# Patient Record
Sex: Male | Born: 1970 | Hispanic: No | Marital: Married | Smoking: Former smoker
Health system: Southern US, Community
[De-identification: ages and names within clinical notes are randomized; demographics above are authoritative.]

## PROBLEM LIST (undated history)

## (undated) DIAGNOSIS — E7800 Pure hypercholesterolemia, unspecified: Secondary | ICD-10-CM

## (undated) HISTORY — PX: FOOT SURGERY: SHX648

---

## 2002-08-02 ENCOUNTER — Inpatient Hospital Stay (HOSPITAL_COMMUNITY): Admission: EM | Admit: 2002-08-02 | Discharge: 2002-08-04 | Payer: Self-pay | Admitting: Emergency Medicine

## 2002-08-02 ENCOUNTER — Encounter: Payer: Self-pay | Admitting: General Surgery

## 2002-08-02 ENCOUNTER — Encounter: Payer: Self-pay | Admitting: Emergency Medicine

## 2014-11-05 LAB — COMPREHENSIVE METABOLIC PANEL
ALBUMIN - PEBFLD: 4.4
ALK PHOS: 74 U/L
ALT: 27
AST: 24 U/L
BILIRUBIN TOTAL: 0.8 mg/dL
BUN: 18
CHOLESTEROL: 183
CO2: 25 mmol/L
Calcium: 9.3 mg/dL
Chloride: 105 mmol/L
Creat: 0.98
GLUCOSE: 105
HDL Cholesterol: 47
Ldl Cholesterol, Calc: 112
POTASSIUM: 4.2 mmol/L
SODIUM: 134
TOTAL PROTEIN: 7.2 g/dL
TRIGLYCERIDES: 121
Total CHOL/HDL Ratio: 3.9
VLDL Cholesterol, Calc: 24

## 2014-11-09 ENCOUNTER — Encounter: Payer: Self-pay | Admitting: Physician Assistant

## 2014-11-09 ENCOUNTER — Ambulatory Visit: Payer: Self-pay | Admitting: Physician Assistant

## 2014-11-09 VITALS — BP 140/84 | HR 60 | Temp 98.1°F | Ht 68.75 in | Wt 219.4 lb

## 2014-11-09 DIAGNOSIS — E785 Hyperlipidemia, unspecified: Secondary | ICD-10-CM | POA: Insufficient documentation

## 2014-11-09 MED ORDER — PRAVASTATIN SODIUM 40 MG PO TABS: 40.0000 mg | ORAL_TABLET | Freq: Every day | ORAL | Status: DC

## 2014-11-09 NOTE — Progress Notes (Signed)
   BP 140/84 mmHg  Pulse 60  Temp(Src) 98.1 F (36.7 C)  Ht 5' 8.75" (1.746 m)  Wt 219 lb 6.4 oz (99.519 kg)  BMI 32.65 kg/m2  SpO2 95%   Subjective:    Patient ID: Devin Ho Haigler, male    DOB: 04-04-1970, 44 y.o.   MRN: [id number]  HPI: Devin Ho Zunker is a 44 y.o. male presenting on 11/09/2014 for Hyperlipidemia   HPI   Chief Complaint  Patient presents with  . Hyperlipidemia    pt states he is feeling well. pt got labs drawn on Friday   Pt states BP up today b/c 1 he is angry and 2 he has been using an otc for sneezing that has decongestant in it.  His last 2 OV blood pressures were good.  Relevant past medical, surgical, family and social history reviewed and updated as indicated. Interim medical history since our last visit reviewed. Allergies and medications reviewed and updated.  Review of Systems  Constitutional: Positive for diaphoresis. Negative for fever, chills, appetite change, fatigue and unexpected weight change.  HENT: Positive for sneezing. Negative for congestion, dental problem, drooling, ear pain, facial swelling, hearing loss, mouth sores, sore throat, trouble swallowing and voice change.   Eyes: Negative for pain, discharge, redness, itching and visual disturbance.  Respiratory: Positive for cough. Negative for choking, shortness of breath and wheezing.   Cardiovascular: Negative for chest pain, palpitations and leg swelling.  Gastrointestinal: Negative for vomiting, abdominal pain, diarrhea, constipation and blood in stool.  Endocrine: Negative for cold intolerance, heat intolerance and polydipsia.  Genitourinary: Negative for dysuria, hematuria and decreased urine volume.  Musculoskeletal: Negative for back pain, arthralgias and gait problem.  Skin: Negative for rash.  Allergic/Immunologic: Positive for environmental allergies.  Neurological: Negative for seizures, syncope, light-headedness and headaches.  Hematological: Negative for adenopathy.   Psychiatric/Behavioral: Negative for suicidal ideas, dysphoric mood and agitation. The patient is not nervous/anxious.     Per HPI unless specifically indicated above     Objective:    BP 140/84 mmHg  Pulse 60  Temp(Src) 98.1 F (36.7 C)  Ht 5' 8.75" (1.746 m)  Wt 219 lb 6.4 oz (99.519 kg)  BMI 32.65 kg/m2  SpO2 95%  Wt Readings from Last 3 Encounters:  11/09/14 219 lb 6.4 oz (99.519 kg)    Physical Exam  Constitutional: He is oriented to person, place, and time. He appears well-developed and well-nourished.  HENT:  Head: Normocephalic and atraumatic.  Neck: Neck supple.  Cardiovascular: Normal rate and regular rhythm.   Pulmonary/Chest: Effort normal and breath sounds normal. He has no wheezes.  Abdominal: Soft. Bowel sounds are normal. There is no tenderness.  Musculoskeletal: He exhibits no edema.  Lymphadenopathy:    He has no cervical adenopathy.  Neurological: He is alert and oriented to person, place, and time.  Skin: Skin is warm and dry.  Psychiatric: He has a normal mood and affect. His behavior is normal.  Vitals reviewed.       Assessment & Plan:      Encounter Diagnosis  Name Primary?  . Hyperlipemia Yes   Reviewed labs with pt. Lipids still high Increase proavstatin to 40. Cont lowfat diet Pt counseled to avoid otc decongestants. Will monitor bp F/u 3 mo. rto sooner prn

## 2014-11-30 DIAGNOSIS — Z880 Allergy status to penicillin: Secondary | ICD-10-CM | POA: Insufficient documentation

## 2014-11-30 DIAGNOSIS — Z87891 Personal history of nicotine dependence: Secondary | ICD-10-CM | POA: Insufficient documentation

## 2014-11-30 DIAGNOSIS — J40 Bronchitis, not specified as acute or chronic: Secondary | ICD-10-CM | POA: Insufficient documentation

## 2014-11-30 DIAGNOSIS — E7800 Pure hypercholesterolemia, unspecified: Secondary | ICD-10-CM | POA: Insufficient documentation

## 2014-12-01 ENCOUNTER — Encounter (HOSPITAL_COMMUNITY): Payer: Self-pay | Admitting: *Deleted

## 2014-12-01 ENCOUNTER — Emergency Department (HOSPITAL_COMMUNITY): Payer: Self-pay

## 2014-12-01 ENCOUNTER — Emergency Department (HOSPITAL_COMMUNITY)
Admission: EM | Admit: 2014-12-01 | Discharge: 2014-12-01 | Disposition: A | Discharge status: Home / Self Care | Payer: Self-pay | Attending: Emergency Medicine | Admitting: Emergency Medicine

## 2014-12-01 DIAGNOSIS — J209 Acute bronchitis, unspecified: Secondary | ICD-10-CM

## 2014-12-01 HISTORY — DX: Pure hypercholesterolemia, unspecified: E78.00

## 2014-12-01 MED ORDER — ALBUTEROL SULFATE (2.5 MG/3ML) 0.083% IN NEBU: 5.0000 mg | INHALATION_SOLUTION | Freq: Once | RESPIRATORY_TRACT | Status: DC

## 2014-12-01 MED ORDER — ALBUTEROL SULFATE (2.5 MG/3ML) 0.083% IN NEBU
2.5000 mg | INHALATION_SOLUTION | Freq: Once | RESPIRATORY_TRACT | Status: AC
Start: 2014-12-01 — End: 2014-12-01
  Administered 2014-12-01: 2.5 mg via RESPIRATORY_TRACT
  Filled 2014-12-01: qty 3

## 2014-12-01 MED ORDER — PREDNISONE 50 MG PO TABS
60.0000 mg | ORAL_TABLET | Freq: Once | ORAL | Status: AC
  Administered 2014-12-01: 60 mg via ORAL
  Filled 2014-12-01: qty 1

## 2014-12-01 MED ORDER — ALBUTEROL SULFATE HFA 108 (90 BASE) MCG/ACT IN AERS
2.0000 | INHALATION_SPRAY | RESPIRATORY_TRACT | Status: DC | PRN
  Administered 2014-12-01: 2 via RESPIRATORY_TRACT
  Filled 2014-12-01: qty 6.7

## 2014-12-01 MED ORDER — DM-GUAIFENESIN ER 30-600 MG PO TB12
1.0000 | ORAL_TABLET | Freq: Two times a day (BID) | ORAL | Status: DC
  Administered 2014-12-01: 1 via ORAL
  Filled 2014-12-01: qty 1

## 2014-12-01 MED ORDER — AEROCHAMBER Z-STAT PLUS/MEDIUM MISC: 1.0000 | Freq: Once | Status: DC

## 2014-12-01 MED ORDER — PREDNISONE 20 MG PO TABS: ORAL_TABLET | ORAL | Status: DC

## 2014-12-01 MED ORDER — ALBUTEROL SULFATE HFA 108 (90 BASE) MCG/ACT IN AERS: 2.0000 | INHALATION_SPRAY | RESPIRATORY_TRACT | Status: DC | PRN

## 2014-12-01 MED ORDER — DM-GUAIFENESIN ER 30-600 MG PO TB12: 1.0000 | ORAL_TABLET | Freq: Two times a day (BID) | ORAL | Status: DC | PRN

## 2014-12-01 MED ORDER — IPRATROPIUM BROMIDE 0.02 % IN SOLN: 0.5000 mg | Freq: Once | RESPIRATORY_TRACT | Status: DC

## 2014-12-01 MED ORDER — IPRATROPIUM-ALBUTEROL 0.5-2.5 (3) MG/3ML IN SOLN
3.0000 mL | Freq: Once | RESPIRATORY_TRACT | Status: AC
  Administered 2014-12-01: 3 mL via RESPIRATORY_TRACT
  Filled 2014-12-01: qty 3

## 2014-12-01 NOTE — Discharge Instructions (Signed)
Use the inhaler for shortness of breath, chest tightness and coughing. Take the mucinex DM for cough. Take the prednisone until gone. Recheck if you get a fever, cough up yellow or green mucus or your breathing gets worse instead of better.

## 2014-12-01 NOTE — ED Provider Notes (Signed)
CSN: [id number]     Arrival date & time 11/30/14  2349 History   First MD Initiated Contact with Patient 12/01/14 0138   Chief Complaint  Patient presents with  . Cough   Level V caveat due to language barrier  (Consider location/radiation/quality/duration/timing/severity/associated sxs/prior Treatment) HPI wife, who speaks [place], reports on November 19 patient started having a cough in the afternoon. The cough has been dry. He complains of his chest feeling tight and that sometimes they can hear wheezing. He has had a inhaler that he used in the past however he has not used it this time. They deny fever, chills, sore throat, or rhinorrhea. He had sneezing she states all day on November 20. He denies any earache. Today he has been coughing continuously.   PCP Katherene PontoMcElroy  Past Medical History  Diagnosis Date  . Hypercholesteremia    Past Surgical History  Procedure Laterality Date  . Foot surgery Left    History reviewed. No pertinent family history. Social History  Substance Use Topics  . Smoking status: Former Smoker -- 0.25 packs/day for 30 years    Types: Cigarettes    Quit date: 01/08/2009  . Smokeless tobacco: Never Used  . Alcohol Use: No   works in Aeronautical engineerlandscaping  Review of Systems  All other systems reviewed and are negative.     Allergies  Penicillins  Home Medications   Prior to Admission medications   Medication Sig Start Date End Date Taking? Authorizing Provider  albuterol (PROVENTIL HFA;VENTOLIN HFA) 108 (90 BASE) MCG/ACT inhaler Inhale 2 puffs into the lungs every 4 (four) hours as needed. 12/01/14   Devoria AlbeIva Mlissa Tamayo, MD  dextromethorphan-guaiFENesin (MUCINEX DM) 30-600 MG 12hr tablet Take 1 tablet by mouth 2 (two) times daily as needed for cough. 12/01/14   Devoria AlbeIva Emonee Winkowski, MD  pravastatin (PRAVACHOL) 40 MG tablet Take 1 tablet (40 mg total) by mouth at bedtime. Tome una tableta por boca al dormir 11/09/14   Jacquelin HawkingShannon McElroy, PA-C  predniSONE (DELTASONE) 20 MG tablet  Take 3 po QD x 3d , then 2 po QD x 3d then 1 po QD x 3d 12/01/14   Devoria AlbeIva Jerick Khachatryan, MD   BP 134/91 mmHg  Pulse 70  Temp(Src) 97.7 F (36.5 C) (Oral)  Resp 20  Ht 5\' 7"  (1.702 m)  Wt 219 lb (99.338 kg)  BMI 34.29 kg/m2  SpO2 99%  Vital signs normal   Physical Exam  Constitutional: He is oriented to person, place, and time. He appears well-developed and well-nourished.  Non-toxic appearance. He does not appear ill. No distress.  HENT:  Head: Normocephalic and atraumatic.  Right Ear: External ear normal.  Left Ear: External ear normal.  Nose: Nose normal. No mucosal edema or rhinorrhea.  Mouth/Throat: Oropharynx is clear and moist and mucous membranes are normal. No dental abscesses or uvula swelling.  Eyes: Conjunctivae and EOM are normal. Pupils are equal, round, and reactive to light.  Neck: Normal range of motion and full passive range of motion without pain. Neck supple.  Cardiovascular: Normal rate, regular rhythm and normal heart sounds.  Exam reveals no gallop and no friction rub.   No murmur heard. Pulmonary/Chest: Effort normal. No respiratory distress. He has decreased breath sounds. He has no wheezes. He has no rhonchi. He has no rales. He exhibits no tenderness and no crepitus.  Patient is continuously coughing  Abdominal: Soft. Normal appearance and bowel sounds are normal. He exhibits no distension. There is no tenderness. There is no rebound and no  guarding.  Musculoskeletal: Normal range of motion. He exhibits no edema or tenderness.  Moves all extremities well.   Neurological: He is alert and oriented to person, place, and time. He has normal strength. No cranial nerve deficit.  Skin: Skin is warm, dry and intact. No rash noted. No erythema. No pallor.  Psychiatric: He has a normal mood and affect. His speech is normal and behavior is normal. His mood appears not anxious.  Nursing note and vitals reviewed.   ED Course  Procedures (including critical care  time)  Medications  dextromethorphan-guaiFENesin (MUCINEX DM) 30-600 MG per 12 hr tablet 1 tablet (1 tablet Oral Given 12/01/14 0159)  albuterol (PROVENTIL HFA;VENTOLIN HFA) 108 (90 BASE) MCG/ACT inhaler 2 puff (not administered)  predniSONE (DELTASONE) tablet 60 mg (not administered)  aerochamber Z-Stat Plus/medium 1 each (not administered)  ipratropium-albuterol (DUONEB) 0.5-2.5 (3) MG/3ML nebulizer solution 3 mL (3 mLs Nebulization Given 12/01/14 0215)  albuterol (PROVENTIL) (2.5 MG/3ML) 0.083% nebulizer solution 2.5 mg (2.5 mg Nebulization Given 12/01/14 0216)    Patient was given albuterol/Atrovent nebulizer to see if that would help with his cough. He also was given a Mucinex DM for his coughing.  Patient was rechecked at time of discharge after his nebulizer treatment. He is lying comfortably on the stretcher in his cough is now gone. On reexam his lungs have improved air movement. There still no wheezing. However patient is much improved after the nebulizer. They were given his CXR results. We discussed going home with a inhaler and also sending him home with steroids for his bronchospasm. Antibiotics were not started because he's only had symptoms for couple days this is most likely a viral type infection.   Imaging Review Dg Chest 2 View  12/01/2014  CLINICAL DATA:  Nonproductive cough, body aches, and sore throat for 4 days. Former smoker. EXAM: CHEST  2 VIEW COMPARISON:  None. FINDINGS: Hyperinflation suggesting emphysema. Normal heart size and pulmonary vascularity. No focal airspace disease or consolidation in the lungs. No blunting of costophrenic angles. No pneumothorax. Mediastinal contours appear intact. Calcification of the aorta. IMPRESSION: Emphysematous changes in the lungs. No evidence of active pulmonary disease. Electronically Signed   By: Burman Nieves M.D.   On: 12/01/2014 00:30   I have personally reviewed and evaluated these images and lab results as part of my  medical decision-making.   EKG Interpretation None      MDM   Final diagnoses:  Bronchitis with bronchospasm    New Prescriptions   ALBUTEROL (PROVENTIL HFA;VENTOLIN HFA) 108 (90 BASE) MCG/ACT INHALER    Inhale 2 puffs into the lungs every 4 (four) hours as needed.   DEXTROMETHORPHAN-GUAIFENESIN (MUCINEX DM) 30-600 MG 12HR TABLET    Take 1 tablet by mouth 2 (two) times daily as needed for cough.   PREDNISONE (DELTASONE) 20 MG TABLET    Take 3 po QD x 3d , then 2 po QD x 3d then 1 po QD x 3d    Plan discharge  Devoria Albe, MD, Concha Pyo, MD 12/01/14 [phone]

## 2014-12-01 NOTE — ED Notes (Signed)
Pt c/o cough x 3 days; pt has been taking OTC medications with no relief; pt states he has not been coughing up anything, just dry cough

## 2014-12-16 ENCOUNTER — Ambulatory Visit: Payer: Self-pay | Admitting: Physician Assistant

## 2014-12-16 ENCOUNTER — Encounter: Payer: Self-pay | Admitting: Physician Assistant

## 2014-12-16 VITALS — BP 142/88 | HR 74 | Temp 97.9°F | Ht 68.75 in | Wt 222.2 lb

## 2014-12-16 DIAGNOSIS — J069 Acute upper respiratory infection, unspecified: Secondary | ICD-10-CM

## 2014-12-16 MED ORDER — ALBUTEROL SULFATE HFA 108 (90 BASE) MCG/ACT IN AERS: INHALATION_SPRAY | RESPIRATORY_TRACT | Status: DC

## 2014-12-16 MED ORDER — BENZONATATE 100 MG PO CAPS: ORAL_CAPSULE | ORAL | Status: DC

## 2014-12-16 NOTE — Progress Notes (Signed)
   BP 142/88 mmHg  Pulse 74  Temp(Src) 97.9 F (36.6 C)  Ht 5' 8.75" (1.746 m)  Wt 222 lb 3.2 oz (100.789 kg)  BMI 33.06 kg/m2  SpO2 96%   Subjective:    Patient ID: Devin Ho, male    DOB: 02/22/1970, 44 y.o.   MRN: [id number]  HPI: Devin Ho is a 44 y.o. male presenting on 12/16/2014 for Nasal Congestion and Cough   HPI  Chief Complaint  Patient presents with  . Nasal Congestion    sx began one month ago  . Cough    Pt went to ER for same on 12/01/14. He was given prednisone and inhaler and he says that it helped.  Pt complains now of cough and nasal congestion.    Relevant past medical, surgical, family and social history reviewed and updated as indicated. Interim medical history since our last visit reviewed. Allergies and medications reviewed and updated.  Review of Systems  Constitutional: Positive for diaphoresis and fatigue. Negative for fever, chills, appetite change and unexpected weight change.  HENT: Positive for congestion, sneezing and voice change. Negative for dental problem, drooling, ear pain, facial swelling, hearing loss, mouth sores, sore throat and trouble swallowing.   Eyes: Negative for pain, discharge, redness, itching and visual disturbance.  Respiratory: Positive for cough and shortness of breath. Negative for choking and wheezing.   Cardiovascular: Negative for chest pain, palpitations and leg swelling.  Gastrointestinal: Negative for vomiting, abdominal pain, diarrhea, constipation and blood in stool.  Endocrine: Negative for cold intolerance, heat intolerance and polydipsia.  Genitourinary: Negative for dysuria, hematuria and decreased urine volume.  Musculoskeletal: Positive for back pain. Negative for arthralgias and gait problem.  Skin: Negative for rash.  Allergic/Immunologic: Negative for environmental allergies.  Neurological: Negative for seizures, syncope, light-headedness and headaches.  Hematological: Negative for adenopathy.   Psychiatric/Behavioral: Negative for suicidal ideas, dysphoric mood and agitation. The patient is not nervous/anxious.     Per HPI unless specifically indicated above     Objective:    BP 142/88 mmHg  Pulse 74  Temp(Src) 97.9 F (36.6 C)  Ht 5' 8.75" (1.746 m)  Wt 222 lb 3.2 oz (100.789 kg)  BMI 33.06 kg/m2  SpO2 96%  Wt Readings from Last 3 Encounters:  12/16/14 222 lb 3.2 oz (100.789 kg)  12/01/14 219 lb (99.338 kg)  11/09/14 219 lb 6.4 oz (99.519 kg)    Physical Exam  Constitutional: He is oriented to person, place, and time. He appears well-developed and well-nourished.  HENT:  Head: Normocephalic and atraumatic.  Neck: Neck supple.  Cardiovascular: Normal rate and regular rhythm.   Pulmonary/Chest: Effort normal and breath sounds normal. He has no wheezes.  Abdominal: Soft. Bowel sounds are normal. There is no tenderness.  Lymphadenopathy:    He has no cervical adenopathy.  Neurological: He is alert and oriented to person, place, and time.  Skin: Skin is warm and dry.  Psychiatric: He has a normal mood and affect. His behavior is normal.  Vitals reviewed.   No results found for this or any previous visit.    Assessment & Plan:   Encounter Diagnosis  Name Primary?  . Acute upper respiratory infection Yes    Pt counseled that cough can take some time to clear.  rx tessalon. Pt wanted another inhaler so rx sent for that as well.  F/u feb as scheduled. rto sooner prn

## 2014-12-20 ENCOUNTER — Telehealth: Payer: Self-pay | Admitting: Student

## 2014-12-20 NOTE — Telephone Encounter (Signed)
Pt's wife Malachi Bonds(Gloria), called stating that pt continues with persistent cough even after taking tessalon given to him by Jacquelin HawkingShannon McElroy, PA-C at last OV. i called pt back after talking to PA-C and she recommended pt to take delsym or robitussin, and for pt to use cough drops. Pt's wife understood, and stated she will get him to try the recommendations.

## 2015-01-21 ENCOUNTER — Encounter: Payer: Self-pay | Admitting: Physician Assistant

## 2015-02-09 ENCOUNTER — Encounter: Payer: Self-pay | Admitting: Physician Assistant

## 2015-02-09 ENCOUNTER — Ambulatory Visit: Payer: Self-pay | Admitting: Physician Assistant

## 2015-02-09 VITALS — BP 140/96 | HR 64 | Temp 97.9°F | Ht 68.75 in | Wt 227.0 lb

## 2015-02-09 DIAGNOSIS — E669 Obesity, unspecified: Secondary | ICD-10-CM

## 2015-02-09 DIAGNOSIS — E785 Hyperlipidemia, unspecified: Secondary | ICD-10-CM

## 2015-02-09 DIAGNOSIS — J449 Chronic obstructive pulmonary disease, unspecified: Secondary | ICD-10-CM

## 2015-02-09 DIAGNOSIS — I10 Essential (primary) hypertension: Secondary | ICD-10-CM | POA: Insufficient documentation

## 2015-02-09 LAB — COMPLETE METABOLIC PANEL WITH GFR
ALT: 28 U/L (ref 9–46)
AST: 23 U/L (ref 10–40)
Albumin: 4.5 g/dL (ref 3.6–5.1)
Alkaline Phosphatase: 66 U/L (ref 40–115)
BILIRUBIN TOTAL: 0.6 mg/dL (ref 0.2–1.2)
BUN: 16 mg/dL (ref 7–25)
CO2: 27 mmol/L (ref 20–31)
CREATININE: 1.04 mg/dL (ref 0.60–1.35)
Calcium: 9.7 mg/dL (ref 8.6–10.3)
Chloride: 104 mmol/L (ref 98–110)
GFR, Est Non African American: 86 mL/min (ref >=60)
GLUCOSE: 102 mg/dL — AB (ref 65–99)
Potassium: 4.8 mmol/L (ref 3.5–5.3)
Sodium: 140 mmol/L (ref 135–146)
TOTAL PROTEIN: 7.4 g/dL (ref 6.1–8.1)

## 2015-02-09 LAB — LIPID PANEL
Cholesterol: 208 mg/dL — ABNORMAL HIGH (ref 125–200)
HDL: 57 mg/dL (ref >=40)
LDL CALC: 120 mg/dL (ref <130)
Total CHOL/HDL Ratio: 3.6 Ratio (ref <=5.0)
Triglycerides: 154 mg/dL — ABNORMAL HIGH (ref <150)
VLDL: 31 mg/dL — AB (ref <30)

## 2015-02-09 MED ORDER — LISINOPRIL 10 MG PO TABS: 10.0000 mg | ORAL_TABLET | Freq: Every day | ORAL | Status: DC

## 2015-02-09 NOTE — Progress Notes (Signed)
BP 140/96 mmHg  Pulse 64  Temp(Src) 97.9 F (36.6 C)  Ht 5' 8.75" (1.746 m)  Wt 227 lb (102.967 kg)  BMI 33.78 kg/m2  SpO2 96%   Subjective:    Patient ID: Devin Ho, male    DOB: 26-Mar-1970, 45 y.o.   MRN: [id number]  HPI: Devin Ho is a 45 y.o. male presenting on 02/09/2015 for Hyperlipidemia   HPI   Pt feeling well.  Relevant past medical, surgical, family and social history reviewed and updated as indicated. Interim medical history since our last visit reviewed. Allergies and medications reviewed and updated.   Current outpatient prescriptions:  .  albuterol (PROVENTIL HFA;VENTOLIN HFA) 108 (90 BASE) MCG/ACT inhaler, 2 puffs q 6 hr prn. haga 2 inhalaciones en los pulmones cada 6 horas cuando necesite para simbilancias o falta de aire, Disp: 1 Inhaler, Rfl: 3 .  budesonide-formoterol (SYMBICORT) 160-4.5 MCG/ACT inhaler, Inhale 2 puffs into the lungs 2 (two) times daily., Disp: , Rfl:  .  pravastatin (PRAVACHOL) 40 MG tablet, Take 1 tablet (40 mg total) by mouth at bedtime. Tome una tableta por boca al dormir, Disp: 30 tablet, Rfl: 4   Review of Systems  Constitutional: Negative for fever, chills, diaphoresis, appetite change, fatigue and unexpected weight change.  HENT: Positive for sneezing. Negative for congestion, dental problem, drooling, ear pain, facial swelling, hearing loss, mouth sores, sore throat, trouble swallowing and voice change.   Eyes: Negative for pain, discharge, redness and visual disturbance.  Respiratory: Negative for cough, choking, shortness of breath and wheezing.   Cardiovascular: Negative for chest pain, palpitations and leg swelling.  Gastrointestinal: Negative for vomiting, abdominal pain, diarrhea, constipation and blood in stool.  Endocrine: Negative for cold intolerance, heat intolerance and polydipsia.  Genitourinary: Negative for dysuria, hematuria and decreased urine volume.  Musculoskeletal: Negative for back pain, arthralgias and  gait problem.  Skin: Negative for rash.  Allergic/Immunologic: Negative for environmental allergies.  Neurological: Negative for seizures, syncope, light-headedness and headaches.  Hematological: Negative for adenopathy.  Psychiatric/Behavioral: Negative for suicidal ideas, dysphoric mood and agitation. The patient is not nervous/anxious.     Per HPI unless specifically indicated above     Objective:    BP 140/96 mmHg  Pulse 64  Temp(Src) 97.9 F (36.6 C)  Ht 5' 8.75" (1.746 m)  Wt 227 lb (102.967 kg)  BMI 33.78 kg/m2  SpO2 96%  Wt Readings from Last 3 Encounters:  02/09/15 227 lb (102.967 kg)  12/16/14 222 lb 3.2 oz (100.789 kg)  12/01/14 219 lb (99.338 kg)    Physical Exam  Constitutional: He is oriented to person, place, and time. He appears well-developed and well-nourished.  HENT:  Head: Normocephalic and atraumatic.  Neck: Neck supple.  Cardiovascular: Normal rate and regular rhythm.   Pulmonary/Chest: Effort normal and breath sounds normal. He has no wheezes.  Abdominal: Soft. Bowel sounds are normal. There is no hepatosplenomegaly. There is no tenderness.  Musculoskeletal: He exhibits no edema.  Lymphadenopathy:    He has no cervical adenopathy.  Neurological: He is alert and oriented to person, place, and time.  Skin: Skin is warm and dry.  Psychiatric: He has a normal mood and affect. His behavior is normal.  Vitals reviewed.       Assessment & Plan:   Encounter Diagnoses  Name Primary?  . Hyperlipidemia Yes  . Essential hypertension   . Chronic obstructive pulmonary disease, unspecified COPD type (HCC)   . Obesity, unspecified     -rx lisinopril  for bp -Get fasting labs drawn. Will call with results -f/u 6 wk to recheck bp

## 2015-03-23 ENCOUNTER — Ambulatory Visit: Payer: Self-pay | Admitting: Physician Assistant

## 2015-03-23 ENCOUNTER — Encounter: Payer: Self-pay | Admitting: Physician Assistant

## 2015-03-23 VITALS — BP 124/88 | HR 71 | Temp 97.9°F | Ht 68.75 in | Wt 227.7 lb

## 2015-03-23 DIAGNOSIS — E785 Hyperlipidemia, unspecified: Secondary | ICD-10-CM

## 2015-03-23 DIAGNOSIS — J449 Chronic obstructive pulmonary disease, unspecified: Secondary | ICD-10-CM

## 2015-03-23 DIAGNOSIS — I10 Essential (primary) hypertension: Secondary | ICD-10-CM

## 2015-03-23 DIAGNOSIS — E669 Obesity, unspecified: Secondary | ICD-10-CM

## 2015-03-23 MED ORDER — LISINOPRIL 10 MG PO TABS: 10.0000 mg | ORAL_TABLET | Freq: Every day | ORAL | Status: DC

## 2015-03-23 MED ORDER — PRAVASTATIN SODIUM 40 MG PO TABS: 40.0000 mg | ORAL_TABLET | Freq: Every day | ORAL | Status: DC

## 2015-03-23 NOTE — Progress Notes (Signed)
BP 124/88 mmHg  Pulse 71  Temp(Src) 97.9 F (36.6 C)  Ht 5' 8.75" (1.746 m)  Wt 227 lb 11.2 oz (103.284 kg)  BMI 33.88 kg/m2  SpO2 96%   Subjective:    Patient ID: Devin Ho, male    DOB: February 22, 1970, 45 y.o.   MRN: [id number]  HPI: Devin FeinsteinLuis Terpening is a 45 y.o. male presenting on 03/23/2015 for Hypertension   HPI  Pt is feeling well  Relevant past medical, surgical, family and social history reviewed and updated as indicated. Interim medical history since our last visit reviewed. Allergies and medications reviewed and updated.  Current outpatient prescriptions:  .  budesonide-formoterol (SYMBICORT) 160-4.5 MCG/ACT inhaler, Inhale 2 puffs into the lungs 2 (two) times daily., Disp: , Rfl:  .  lisinopril (PRINIVIL,ZESTRIL) 10 MG tablet, Take 1 tablet (10 mg total) by mouth daily. Tome una tableta por boca diaria, Disp: 30 tablet, Rfl: 1 .  pravastatin (PRAVACHOL) 40 MG tablet, Take 1 tablet (40 mg total) by mouth at bedtime. Tome una tableta por boca al dormir, Disp: 30 tablet, Rfl: 4 .  albuterol (PROVENTIL HFA;VENTOLIN HFA) 108 (90 BASE) MCG/ACT inhaler, 2 puffs q 6 hr prn. haga 2 inhalaciones en los pulmones cada 6 horas cuando necesite para simbilancias o falta de aire (Patient not taking: Reported on 03/23/2015), Disp: 1 Inhaler, Rfl: 3  Review of Systems  Constitutional: Negative for fever, chills, diaphoresis, appetite change, fatigue and unexpected weight change.  HENT: Negative for congestion, dental problem, drooling, ear pain, facial swelling, hearing loss, mouth sores, sneezing, sore throat, trouble swallowing and voice change.   Eyes: Negative for pain, discharge, redness, itching and visual disturbance.  Respiratory: Negative for cough, choking, shortness of breath and wheezing.   Cardiovascular: Negative for chest pain, palpitations and leg swelling.  Gastrointestinal: Negative for vomiting, abdominal pain, diarrhea, constipation and blood in stool.  Endocrine:  Negative for cold intolerance, heat intolerance and polydipsia.  Genitourinary: Negative for dysuria, hematuria and decreased urine volume.  Musculoskeletal: Negative for back pain, arthralgias and gait problem.  Skin: Negative for rash.  Allergic/Immunologic: Negative for environmental allergies.  Neurological: Negative for seizures, syncope, light-headedness and headaches.  Hematological: Negative for adenopathy.  Psychiatric/Behavioral: Negative for suicidal ideas, dysphoric mood and agitation. The patient is not nervous/anxious.     Per HPI unless specifically indicated above     Objective:    BP 124/88 mmHg  Pulse 71  Temp(Src) 97.9 F (36.6 C)  Ht 5' 8.75" (1.746 m)  Wt 227 lb 11.2 oz (103.284 kg)  BMI 33.88 kg/m2  SpO2 96%  Wt Readings from Last 3 Encounters:  03/23/15 227 lb 11.2 oz (103.284 kg)  02/09/15 227 lb (102.967 kg)  12/16/14 222 lb 3.2 oz (100.789 kg)    Physical Exam  Constitutional: He is oriented to person, place, and time. He appears well-developed and well-nourished.  HENT:  Head: Normocephalic and atraumatic.  Neck: Neck supple.  Cardiovascular: Normal rate and regular rhythm.   Pulmonary/Chest: Effort normal and breath sounds normal. He has no wheezes.  Abdominal: Soft. Bowel sounds are normal. There is no hepatosplenomegaly. There is no tenderness.  Musculoskeletal: He exhibits no edema.  Lymphadenopathy:    He has no cervical adenopathy.  Neurological: He is alert and oriented to person, place, and time.  Skin: Skin is warm and dry.  Psychiatric: He has a normal mood and affect. His behavior is normal.  Vitals reviewed.   Results for orders placed or performed in visit  on 02/09/15  COMPLETE METABOLIC PANEL WITH GFR  Result Value Ref Range   Sodium 140 135 - 146 mmol/L   Potassium 4.8 3.5 - 5.3 mmol/L   Chloride 104 98 - 110 mmol/L   CO2 27 20 - 31 mmol/L   Glucose, Bld 102 (H) 65 - 99 mg/dL   BUN 16 7 - 25 mg/dL   Creat 1.61 0.96 - 0.45  mg/dL   Total Bilirubin 0.6 0.2 - 1.2 mg/dL   Alkaline Phosphatase 66 40 - 115 U/L   AST 23 10 - 40 U/L   ALT 28 9 - 46 U/L   Total Protein 7.4 6.1 - 8.1 g/dL   Albumin 4.5 3.6 - 5.1 g/dL   Calcium 9.7 8.6 - 40.9 mg/dL   GFR, Est African American >89 >=60 mL/min   GFR, Est Non African American 86 >=60 mL/min  Lipid Profile  Result Value Ref Range   Cholesterol 208 (H) 125 - 200 mg/dL   Triglycerides 811 (H) <150 mg/dL   HDL 57 >=91 mg/dL   Total CHOL/HDL Ratio 3.6 <=5.0 Ratio   VLDL 31 (H) <30 mg/dL   LDL Cholesterol 478 <295 mg/dL      Assessment & Plan:   Encounter Diagnoses  Name Primary?  . Essential hypertension Yes  . Hyperlipidemia   . Chronic obstructive pulmonary disease, unspecified COPD type (HCC)   . Obesity, unspecified     -Reviewed labs with pt -Continue current rx- bp improved on lisinopril -F/u 3 months.  RTO sooner prn

## 2015-06-16 ENCOUNTER — Other Ambulatory Visit: Payer: Self-pay | Admitting: Physician Assistant

## 2015-06-21 ENCOUNTER — Other Ambulatory Visit: Payer: Self-pay | Admitting: Student

## 2015-06-21 DIAGNOSIS — E785 Hyperlipidemia, unspecified: Secondary | ICD-10-CM

## 2015-06-21 DIAGNOSIS — I10 Essential (primary) hypertension: Secondary | ICD-10-CM

## 2015-06-23 LAB — COMPLETE METABOLIC PANEL WITH GFR
ALBUMIN: 4.2 g/dL (ref 3.6–5.1)
ALK PHOS: 63 U/L (ref 40–115)
ALT: 24 U/L (ref 9–46)
AST: 22 U/L (ref 10–40)
BILIRUBIN TOTAL: 0.4 mg/dL (ref 0.2–1.2)
BUN: 14 mg/dL (ref 7–25)
CALCIUM: 9.3 mg/dL (ref 8.6–10.3)
CO2: 19 mmol/L — AB (ref 20–31)
Chloride: 106 mmol/L (ref 98–110)
Creat: 0.95 mg/dL (ref 0.60–1.35)
GLUCOSE: 102 mg/dL — AB (ref 65–99)
POTASSIUM: 4.2 mmol/L (ref 3.5–5.3)
Sodium: 140 mmol/L (ref 135–146)
Total Protein: 6.9 g/dL (ref 6.1–8.1)

## 2015-06-23 LAB — LIPID PANEL
CHOL/HDL RATIO: 4 ratio (ref <=5.0)
Cholesterol: 186 mg/dL (ref 125–200)
HDL: 47 mg/dL (ref >=40)
LDL CALC: 101 mg/dL (ref <130)
Triglycerides: 191 mg/dL — ABNORMAL HIGH (ref <150)
VLDL: 38 mg/dL — ABNORMAL HIGH (ref <30)

## 2015-06-27 ENCOUNTER — Ambulatory Visit: Payer: Self-pay | Admitting: Physician Assistant

## 2015-06-27 ENCOUNTER — Encounter: Payer: Self-pay | Admitting: Physician Assistant

## 2015-06-27 VITALS — BP 128/70 | HR 63 | Temp 97.7°F | Ht 68.75 in | Wt 224.6 lb

## 2015-06-27 DIAGNOSIS — J449 Chronic obstructive pulmonary disease, unspecified: Secondary | ICD-10-CM

## 2015-06-27 DIAGNOSIS — E785 Hyperlipidemia, unspecified: Secondary | ICD-10-CM

## 2015-06-27 DIAGNOSIS — E669 Obesity, unspecified: Secondary | ICD-10-CM

## 2015-06-27 DIAGNOSIS — I10 Essential (primary) hypertension: Secondary | ICD-10-CM

## 2015-06-27 NOTE — Progress Notes (Signed)
BP 128/70 mmHg  Pulse 63  Temp(Src) 97.7 F (36.5 C)  Ht 5' 8.75" (1.746 m)  Wt 224 lb 9.6 oz (101.878 kg)  BMI 33.42 kg/m2  SpO2 97%   Subjective:    Patient ID: Devin Ho, male    DOB: [birth year], 45 y.o.   MRN: [id number]  HPI: Devin Ho is a 45 y.o. male presenting on 06/27/2015 for Hypertension and Hyperlipidemia   HPI  Pt states he is doing well. No problems.   Relevant past medical, surgical, family and social history reviewed and updated as indicated. Interim medical history since our last visit reviewed. Allergies and medications reviewed and updated.   Current outpatient prescriptions:  .  beclomethasone (QVAR) 80 MCG/ACT inhaler, Inhale 2 puffs into the lungs 2 (two) times daily., Disp: , Rfl:  .  lisinopril (PRINIVIL,ZESTRIL) 10 MG tablet, Take 1 tablet (10 mg total) by mouth daily. Tome una tableta por boca diaria, Disp: 30 tablet, Rfl: 3 .  pravastatin (PRAVACHOL) 40 MG tablet, Take 1 tablet (40 mg total) by mouth at bedtime. Tome una tableta por boca al dormir, Disp: 30 tablet, Rfl: 4 .  albuterol (PROVENTIL HFA;VENTOLIN HFA) 108 (90 BASE) MCG/ACT inhaler, 2 puffs q 6 hr prn. haga 2 inhalaciones en los pulmones cada 6 horas cuando necesite para simbilancias o falta de aire (Patient not taking: Reported on 03/23/2015), Disp: 1 Inhaler, Rfl: 3 .  budesonide-formoterol (SYMBICORT) 160-4.5 MCG/ACT inhaler, Inhale 2 puffs into the lungs 2 (two) times daily. Reported on 06/27/2015, Disp: , Rfl:    Review of Systems  Constitutional: Negative for fever, chills, diaphoresis, appetite change, fatigue and unexpected weight change.  HENT: Negative for congestion, dental problem, drooling, ear pain, facial swelling, hearing loss, mouth sores, sneezing, sore throat, trouble swallowing and voice change.   Eyes: Negative for pain, discharge, redness, itching and visual disturbance.  Respiratory: Negative for cough, choking, shortness of breath and wheezing.    Cardiovascular: Negative for chest pain, palpitations and leg swelling.  Gastrointestinal: Negative for vomiting, abdominal pain, diarrhea, constipation and blood in stool.  Endocrine: Negative for cold intolerance, heat intolerance and polydipsia.  Genitourinary: Negative for dysuria, hematuria and decreased urine volume.  Musculoskeletal: Negative for back pain, arthralgias and gait problem.  Skin: Negative for rash.  Allergic/Immunologic: Positive for environmental allergies.  Neurological: Negative for seizures, syncope, light-headedness and headaches.  Hematological: Negative for adenopathy.  Psychiatric/Behavioral: Negative for suicidal ideas, dysphoric mood and agitation. The patient is not nervous/anxious.     Per HPI unless specifically indicated above     Objective:    BP 128/70 mmHg  Pulse 63  Temp(Src) 97.7 F (36.5 C)  Ht 5' 8.75" (1.746 m)  Wt 224 lb 9.6 oz (101.878 kg)  BMI 33.42 kg/m2  SpO2 97%  Wt Readings from Last 3 Encounters:  06/27/15 224 lb 9.6 oz (101.878 kg)  03/23/15 227 lb 11.2 oz (103.284 kg)  02/09/15 227 lb (102.967 kg)    Physical Exam  Constitutional: He is oriented to person, place, and time. He appears well-developed and well-nourished.  HENT:  Head: Normocephalic and atraumatic.  Neck: Neck supple.  Cardiovascular: Normal rate and regular rhythm.   Pulmonary/Chest: Effort normal and breath sounds normal. He has no wheezes.  Abdominal: Soft. Bowel sounds are normal. There is no hepatosplenomegaly. There is no tenderness.  Musculoskeletal: He exhibits no edema.  Lymphadenopathy:    He has no cervical adenopathy.  Neurological: He is alert and oriented to person, place, and time.  Skin: Skin is warm and dry.  Psychiatric: He has a normal mood and affect. His behavior is normal.  Vitals reviewed.   Results for orders placed or performed in visit on 06/21/15  COMPLETE METABOLIC PANEL WITH GFR  Result Value Ref Range   Sodium 140 135 -  146 mmol/L   Potassium 4.2 3.5 - 5.3 mmol/L   Chloride 106 98 - 110 mmol/L   CO2 19 (L) 20 - 31 mmol/L   Glucose, Bld 102 (H) 65 - 99 mg/dL   BUN 14 7 - 25 mg/dL   Creat 1.61 0.96 - 0.45 mg/dL   Total Bilirubin 0.4 0.2 - 1.2 mg/dL   Alkaline Phosphatase 63 40 - 115 U/L   AST 22 10 - 40 U/L   ALT 24 9 - 46 U/L   Total Protein 6.9 6.1 - 8.1 g/dL   Albumin 4.2 3.6 - 5.1 g/dL   Calcium 9.3 8.6 - 40.9 mg/dL   GFR, Est African American >89 >=60 mL/min   GFR, Est Non African American >89 >=60 mL/min  Lipid Profile  Result Value Ref Range   Cholesterol 186 125 - 200 mg/dL   Triglycerides 811 (H) <150 mg/dL   HDL 47 >=91 mg/dL   Total CHOL/HDL Ratio 4.0 <=5.0 Ratio   VLDL 38 (H) <30 mg/dL   LDL Cholesterol 478 <295 mg/dL      Assessment & Plan:   Encounter Diagnoses  Name Primary?  . Chronic obstructive pulmonary disease, unspecified COPD type (HCC) Yes  . Hyperlipidemia   . Essential hypertension   . Obesity, unspecified     -Reviewed labs with pt -Continue current -F/u 4 months.  RTO sooner prn

## 2015-09-27 ENCOUNTER — Other Ambulatory Visit: Payer: Self-pay | Admitting: Physician Assistant

## 2015-10-19 ENCOUNTER — Other Ambulatory Visit: Payer: Self-pay | Admitting: Student

## 2015-10-19 DIAGNOSIS — E785 Hyperlipidemia, unspecified: Secondary | ICD-10-CM

## 2015-10-19 DIAGNOSIS — I10 Essential (primary) hypertension: Secondary | ICD-10-CM

## 2015-10-21 LAB — COMPLETE METABOLIC PANEL WITH GFR
ALBUMIN: 4.1 g/dL (ref 3.6–5.1)
ALK PHOS: 62 U/L (ref 40–115)
ALT: 25 U/L (ref 9–46)
AST: 22 U/L (ref 10–40)
BILIRUBIN TOTAL: 0.6 mg/dL (ref 0.2–1.2)
BUN: 15 mg/dL (ref 7–25)
CALCIUM: 9.5 mg/dL (ref 8.6–10.3)
CO2: 26 mmol/L (ref 20–31)
CREATININE: 0.92 mg/dL (ref 0.60–1.35)
Chloride: 105 mmol/L (ref 98–110)
GFR, Est African American: >89 mL/min (ref >=60)
GFR, Est Non African American: >89 mL/min (ref >=60)
GLUCOSE: 100 mg/dL — AB (ref 65–99)
Potassium: 4.3 mmol/L (ref 3.5–5.3)
Sodium: 138 mmol/L (ref 135–146)
TOTAL PROTEIN: 6.7 g/dL (ref 6.1–8.1)

## 2015-10-21 LAB — LIPID PANEL
CHOLESTEROL: 161 mg/dL (ref 125–200)
HDL: 43 mg/dL (ref >=40)
LDL Cholesterol: 91 mg/dL (ref <130)
Total CHOL/HDL Ratio: 3.7 Ratio (ref <=5.0)
Triglycerides: 134 mg/dL (ref <150)
VLDL: 27 mg/dL (ref <30)

## 2015-10-26 ENCOUNTER — Ambulatory Visit: Payer: Self-pay | Admitting: Physician Assistant

## 2015-10-26 ENCOUNTER — Encounter: Payer: Self-pay | Admitting: Physician Assistant

## 2015-10-26 VITALS — BP 118/60 | HR 72 | Temp 97.9°F | Wt 226.2 lb

## 2015-10-26 DIAGNOSIS — Z6833 Body mass index (BMI) 33.0-33.9, adult: Secondary | ICD-10-CM

## 2015-10-26 DIAGNOSIS — E669 Obesity, unspecified: Secondary | ICD-10-CM

## 2015-10-26 DIAGNOSIS — E785 Hyperlipidemia, unspecified: Secondary | ICD-10-CM

## 2015-10-26 DIAGNOSIS — I10 Essential (primary) hypertension: Secondary | ICD-10-CM

## 2015-10-26 DIAGNOSIS — J449 Chronic obstructive pulmonary disease, unspecified: Secondary | ICD-10-CM

## 2015-10-26 MED ORDER — LISINOPRIL 10 MG PO TABS: 10.0000 mg | ORAL_TABLET | Freq: Every day | ORAL | 5 refills | Status: DC

## 2015-10-26 NOTE — Progress Notes (Signed)
BP 118/60 (BP Location: Left Arm, Patient Position: Sitting, Cuff Size: Normal)   Pulse 72   Temp 97.9 F (36.6 C)   Wt 226 lb 4 oz (102.6 kg)   SpO2 97%   BMI 33.65 kg/m    Subjective:    Patient ID: Devin Ho, male    DOB: January 28, 1970, 45 y.o.   MRN: [id number]  HPI: Devin Ho is a 45 y.o. male presenting on 10/26/2015 for Hypertension and Hyperlipidemia   HPI   Pt States diarrhea x 2 wk. States 2-3 episodeds daily.  No blood in stool.  No abdominal pain.   No recent antibiotics. No international travel.  States no form- like water.    Pt is out of inhalers but says breathing has been fine.  Relevant past medical, surgical, family and social history reviewed and updated as indicated. Interim medical history since our last visit reviewed. Allergies and medications reviewed and updated.   Current Outpatient Prescriptions:  .  lisinopril (PRINIVIL,ZESTRIL) 10 MG tablet, Take 1 tablet (10 mg total) by mouth daily. Tome una tableta por boca diaria, Disp: 30 tablet, Rfl: 3 .  pravastatin (PRAVACHOL) 40 MG tablet, TAKE ONE TABLET BY MOUTH AT BEDTIME, Disp: 30 tablet, Rfl: 4 .  albuterol (PROVENTIL HFA;VENTOLIN HFA) 108 (90 BASE) MCG/ACT inhaler, 2 puffs q 6 hr prn. haga 2 inhalaciones en los pulmones cada 6 horas cuando necesite para simbilancias o falta de aire (Patient not taking: Reported on 10/26/2015), Disp: 1 Inhaler, Rfl: 3 .  beclomethasone (QVAR) 80 MCG/ACT inhaler, Inhale 2 puffs into the lungs 2 (two) times daily., Disp: , Rfl:  .  budesonide-formoterol (SYMBICORT) 160-4.5 MCG/ACT inhaler, Inhale 2 puffs into the lungs 2 (two) times daily. Reported on 06/27/2015, Disp: , Rfl:    Review of Systems  Constitutional: Negative for appetite change, chills, diaphoresis, fatigue, fever and unexpected weight change.  HENT: Negative for congestion, dental problem, drooling, ear pain, facial swelling, hearing loss, mouth sores, sneezing, sore throat, trouble swallowing and  voice change.   Eyes: Negative for pain, discharge, redness, itching and visual disturbance.  Respiratory: Negative for cough, choking, shortness of breath and wheezing.   Cardiovascular: Negative for chest pain, palpitations and leg swelling.  Gastrointestinal: Positive for diarrhea. Negative for abdominal pain, blood in stool, constipation and vomiting.  Endocrine: Negative for cold intolerance, heat intolerance and polydipsia.  Genitourinary: Negative for decreased urine volume, dysuria and hematuria.  Musculoskeletal: Positive for back pain. Negative for arthralgias and gait problem.  Skin: Negative for rash.  Allergic/Immunologic: Negative for environmental allergies.  Neurological: Negative for seizures, syncope, light-headedness and headaches.  Hematological: Negative for adenopathy.  Psychiatric/Behavioral: Negative for agitation, dysphoric mood and suicidal ideas. The patient is not nervous/anxious.     Per HPI unless specifically indicated above     Objective:    BP 118/60 (BP Location: Left Arm, Patient Position: Sitting, Cuff Size: Normal)   Pulse 72   Temp 97.9 F (36.6 C)   Wt 226 lb 4 oz (102.6 kg)   SpO2 97%   BMI 33.65 kg/m   Wt Readings from Last 3 Encounters:  10/26/15 226 lb 4 oz (102.6 kg)  06/27/15 224 lb 9.6 oz (101.9 kg)  03/23/15 227 lb 11.2 oz (103.3 kg)    Physical Exam  Constitutional: He is oriented to person, place, and time. He appears well-developed and well-nourished.  HENT:  Head: Normocephalic and atraumatic.  Neck: Neck supple.  Cardiovascular: Normal rate and regular rhythm.   Pulmonary/Chest:  Effort normal and breath sounds normal. He has no wheezes.  Abdominal: Soft. Bowel sounds are normal. There is no hepatosplenomegaly. There is no tenderness.  Musculoskeletal: He exhibits no edema.  Lymphadenopathy:    He has no cervical adenopathy.  Neurological: He is alert and oriented to person, place, and time.  Skin: Skin is warm and dry.   Psychiatric: He has a normal mood and affect. His behavior is normal.  Vitals reviewed.   Results for orders placed or performed in visit on 10/19/15  COMPLETE METABOLIC PANEL WITH GFR  Result Value Ref Range   Sodium 138 135 - 146 mmol/L   Potassium 4.3 3.5 - 5.3 mmol/L   Chloride 105 98 - 110 mmol/L   CO2 26 20 - 31 mmol/L   Glucose, Bld 100 (H) 65 - 99 mg/dL   BUN 15 7 - 25 mg/dL   Creat 1.610.92 0.960.60 - 0.451.35 mg/dL   Total Bilirubin 0.6 0.2 - 1.2 mg/dL   Alkaline Phosphatase 62 40 - 115 U/L   AST 22 10 - 40 U/L   ALT 25 9 - 46 U/L   Total Protein 6.7 6.1 - 8.1 g/dL   Albumin 4.1 3.6 - 5.1 g/dL   Calcium 9.5 8.6 - 40.910.3 mg/dL   GFR, Est African American >89 >=60 mL/min   GFR, Est Non African American >89 >=60 mL/min  Lipid Profile  Result Value Ref Range   Cholesterol 161 125 - 200 mg/dL   Triglycerides 811134 <914<150 mg/dL   HDL 43 >=78>=40 mg/dL   Total CHOL/HDL Ratio 3.7 <=5.0 Ratio   VLDL 27 <30 mg/dL   LDL Cholesterol 91 <295<130 mg/dL      Assessment & Plan:   Encounter Diagnoses  Name Primary?  . Essential hypertension Yes  . Hyperlipidemia, unspecified hyperlipidemia type   . Chronic obstructive pulmonary disease, unspecified COPD type (HCC)   . Class 1 obesity with body mass index (BMI) of 33.0 to 33.9 in adult, unspecified obesity type, unspecified whether serious comorbidity present     -Reviewed labs with pt -Continue current meds -Offered stool studies for diarrhea.  Pt declined.  Told him to return to office if diarrhea persists -otherwise, follow up in 4 months

## 2016-02-22 ENCOUNTER — Other Ambulatory Visit: Payer: Self-pay | Admitting: Student

## 2016-02-22 DIAGNOSIS — I10 Essential (primary) hypertension: Secondary | ICD-10-CM

## 2016-02-22 DIAGNOSIS — E785 Hyperlipidemia, unspecified: Secondary | ICD-10-CM

## 2016-02-28 LAB — COMPREHENSIVE METABOLIC PANEL
ALBUMIN: 4.4 g/dL (ref 3.6–5.1)
ALT: 28 U/L (ref 9–46)
AST: 17 U/L (ref 10–40)
Alkaline Phosphatase: 65 U/L (ref 40–115)
BUN: 16 mg/dL (ref 7–25)
CHLORIDE: 107 mmol/L (ref 98–110)
CO2: 26 mmol/L (ref 20–31)
Calcium: 9.4 mg/dL (ref 8.6–10.3)
Creat: 1.04 mg/dL (ref 0.60–1.35)
Glucose, Bld: 100 mg/dL — ABNORMAL HIGH (ref 65–99)
POTASSIUM: 4.4 mmol/L (ref 3.5–5.3)
Sodium: 141 mmol/L (ref 135–146)
TOTAL PROTEIN: 7.2 g/dL (ref 6.1–8.1)
Total Bilirubin: 0.5 mg/dL (ref 0.2–1.2)

## 2016-02-28 LAB — LIPID PANEL
CHOL/HDL RATIO: 3.6 ratio (ref <5.0)
CHOLESTEROL: 166 mg/dL (ref <200)
HDL: 46 mg/dL (ref >40)
LDL Cholesterol: 97 mg/dL (ref <100)
TRIGLYCERIDES: 113 mg/dL (ref <150)
VLDL: 23 mg/dL (ref <30)

## 2016-02-29 ENCOUNTER — Encounter: Payer: Self-pay | Admitting: Physician Assistant

## 2016-02-29 ENCOUNTER — Ambulatory Visit: Payer: Self-pay | Admitting: Physician Assistant

## 2016-02-29 VITALS — BP 132/80 | HR 82 | Temp 97.3°F | Ht 68.75 in | Wt 227.5 lb

## 2016-02-29 DIAGNOSIS — E785 Hyperlipidemia, unspecified: Secondary | ICD-10-CM

## 2016-02-29 DIAGNOSIS — I10 Essential (primary) hypertension: Secondary | ICD-10-CM

## 2016-02-29 DIAGNOSIS — E669 Obesity, unspecified: Secondary | ICD-10-CM

## 2016-02-29 DIAGNOSIS — R351 Nocturia: Secondary | ICD-10-CM

## 2016-02-29 DIAGNOSIS — J449 Chronic obstructive pulmonary disease, unspecified: Secondary | ICD-10-CM

## 2016-02-29 DIAGNOSIS — Z6833 Body mass index (BMI) 33.0-33.9, adult: Secondary | ICD-10-CM

## 2016-02-29 LAB — POCT URINALYSIS DIPSTICK
Bilirubin, UA: NEGATIVE
Glucose, UA: NEGATIVE
Ketones, UA: NEGATIVE
Leukocytes, UA: NEGATIVE
NITRITE UA: NEGATIVE
PH UA: 6
PROTEIN UA: NEGATIVE
RBC UA: NEGATIVE
SPEC GRAV UA: 1.025
UROBILINOGEN UA: 1

## 2016-02-29 NOTE — Patient Instructions (Signed)
Avoid caffeine.   No drinking within 2 hours of going to bed.  Drink more water during the day (before suppertime)

## 2016-02-29 NOTE — Progress Notes (Signed)
BP 132/80 (BP Location: Left Arm, Patient Position: Sitting, Cuff Size: Large)   Pulse 82   Temp 97.3 F (36.3 C)   Ht 5' 8.75" (1.746 m)   Wt 227 lb 8 oz (103.2 kg)   SpO2 97%   BMI 33.84 kg/m    Subjective:    Patient ID: Devin Ho, male    DOB: 06-May-1970, 46 y.o.   MRN: [id number]  HPI: Devin Ho is a 46 y.o. male presenting on 02/29/2016 for Hyperlipidemia and Hypertension   HPI   Pt states nocturia- sometimes up to 7 times.  His stream is strong.   Otherwise pt is doing well    Relevant past medical, surgical, family and social history reviewed and updated as indicated. Interim medical history since our last visit reviewed. Allergies and medications reviewed and updated.   Current Outpatient Prescriptions:  .  albuterol (PROVENTIL HFA;VENTOLIN HFA) 108 (90 BASE) MCG/ACT inhaler, 2 puffs q 6 hr prn. haga 2 inhalaciones en los pulmones cada 6 horas cuando necesite para simbilancias o falta de aire, Disp: 1 Inhaler, Rfl: 3 .  budesonide-formoterol (SYMBICORT) 160-4.5 MCG/ACT inhaler, Inhale 2 puffs into the lungs daily. Reported on 06/27/2015, Disp: , Rfl:  .  lisinopril (PRINIVIL,ZESTRIL) 10 MG tablet, Take 1 tablet (10 mg total) by mouth daily. Tome una tableta por boca diaria, Disp: 30 tablet, Rfl: 5 .  pravastatin (PRAVACHOL) 40 MG tablet, TAKE ONE TABLET BY MOUTH AT BEDTIME, Disp: 30 tablet, Rfl: 4   Review of Systems  Constitutional: Negative for appetite change, chills, diaphoresis, fatigue, fever and unexpected weight change.  HENT: Negative for congestion, dental problem, drooling, ear pain, facial swelling, hearing loss, mouth sores, sneezing, sore throat, trouble swallowing and voice change.   Eyes: Negative for pain, discharge, redness, itching and visual disturbance.  Respiratory: Negative for cough, choking, shortness of breath and wheezing.   Cardiovascular: Negative for chest pain, palpitations and leg swelling.  Gastrointestinal: Negative for  abdominal pain, blood in stool, constipation, diarrhea and vomiting.  Endocrine: Negative for cold intolerance, heat intolerance and polydipsia.  Genitourinary: Negative for decreased urine volume, dysuria and hematuria.  Musculoskeletal: Negative for arthralgias, back pain and gait problem.  Skin: Negative for rash.  Allergic/Immunologic: Negative for environmental allergies.  Neurological: Negative for seizures, syncope, light-headedness and headaches.  Hematological: Negative for adenopathy.  Psychiatric/Behavioral: Negative for agitation, dysphoric mood and suicidal ideas. The patient is not nervous/anxious.     Per HPI unless specifically indicated above     Objective:    BP 132/80 (BP Location: Left Arm, Patient Position: Sitting, Cuff Size: Large)   Pulse 82   Temp 97.3 F (36.3 C)   Ht 5' 8.75" (1.746 m)   Wt 227 lb 8 oz (103.2 kg)   SpO2 97%   BMI 33.84 kg/m   Wt Readings from Last 3 Encounters:  02/29/16 227 lb 8 oz (103.2 kg)  10/26/15 226 lb 4 oz (102.6 kg)  06/27/15 224 lb 9.6 oz (101.9 kg)    Physical Exam  Constitutional: He is oriented to person, place, and time. He appears well-developed and well-nourished.  HENT:  Head: Normocephalic and atraumatic.  Neck: Neck supple.  Cardiovascular: Normal rate and regular rhythm.   Pulmonary/Chest: Effort normal and breath sounds normal. He has no wheezes.  Abdominal: Soft. Bowel sounds are normal. There is no hepatosplenomegaly. There is no tenderness.  Musculoskeletal: He exhibits no edema.  Lymphadenopathy:    He has no cervical adenopathy.  Neurological: He is  alert and oriented to person, place, and time.  Skin: Skin is warm and dry.  Psychiatric: He has a normal mood and affect. His behavior is normal.  Vitals reviewed.   Results for orders placed or performed in visit on 02/22/16  Comprehensive Metabolic Panel (CMET)  Result Value Ref Range   Sodium 141 135 - 146 mmol/L   Potassium 4.4 3.5 - 5.3 mmol/L    Chloride 107 98 - 110 mmol/L   CO2 26 20 - 31 mmol/L   Glucose, Bld 100 (H) 65 - 99 mg/dL   BUN 16 7 - 25 mg/dL   Creat 1.61 0.96 - 0.45 mg/dL   Total Bilirubin 0.5 0.2 - 1.2 mg/dL   Alkaline Phosphatase 65 40 - 115 U/L   AST 17 10 - 40 U/L   ALT 28 9 - 46 U/L   Total Protein 7.2 6.1 - 8.1 g/dL   Albumin 4.4 3.6 - 5.1 g/dL   Calcium 9.4 8.6 - 40.9 mg/dL  Lipid Profile  Result Value Ref Range   Cholesterol 166 <200 mg/dL   Triglycerides 811 <914 mg/dL   HDL 46 >78 mg/dL   Total CHOL/HDL Ratio 3.6 <5.0 Ratio   VLDL 23 <30 mg/dL   LDL Cholesterol 97 <295 mg/dL   UA wnl     Assessment & Plan:   Encounter Diagnoses  Name Primary?  . Essential hypertension Yes  . Hyperlipidemia, unspecified hyperlipidemia type   . Chronic obstructive pulmonary disease, unspecified COPD type (HCC)   . Class 1 obesity with body mass index (BMI) of 33.0 to 33.9 in adult, unspecified obesity type, unspecified whether serious comorbidity present   . Nocturia     -reviewed labs with pt -continue current medications -Check psa with next labs -counseled pt to drink more water during the day.  Avoid caffeine.  No drinking within 2 hours of bedtime -follow up 3 months.  RTO sooner prn

## 2016-04-11 ENCOUNTER — Other Ambulatory Visit: Payer: Self-pay | Admitting: Physician Assistant

## 2016-05-23 ENCOUNTER — Other Ambulatory Visit (HOSPITAL_COMMUNITY)
Admission: RE | Admit: 2016-05-23 | Discharge: 2016-05-23 | Disposition: A | Discharge status: Home / Self Care | Payer: Self-pay | Source: Ambulatory Visit | Attending: Physician Assistant | Admitting: Physician Assistant

## 2016-05-23 LAB — COMPREHENSIVE METABOLIC PANEL
ALT: 33 U/L (ref 17–63)
AST: 34 U/L (ref 15–41)
Albumin: 4.4 g/dL (ref 3.5–5.0)
Alkaline Phosphatase: 58 U/L (ref 38–126)
Anion gap: 7 (ref 5–15)
BILIRUBIN TOTAL: 1.3 mg/dL — AB (ref 0.3–1.2)
BUN: 20 mg/dL (ref 6–20)
CHLORIDE: 107 mmol/L (ref 101–111)
CO2: 22 mmol/L (ref 22–32)
CREATININE: 0.94 mg/dL (ref 0.61–1.24)
Calcium: 9.1 mg/dL (ref 8.9–10.3)
GFR calc Af Amer: >60 mL/min (ref >60)
Glucose, Bld: 102 mg/dL — ABNORMAL HIGH (ref 65–99)
Potassium: 3.8 mmol/L (ref 3.5–5.1)
Sodium: 136 mmol/L (ref 135–145)
Total Protein: 7.6 g/dL (ref 6.5–8.1)

## 2016-05-23 LAB — LIPID PANEL
CHOLESTEROL: 167 mg/dL (ref 0–200)
HDL: 45 mg/dL (ref >40)
LDL Cholesterol: 94 mg/dL (ref 0–99)
Total CHOL/HDL Ratio: 3.7 RATIO
Triglycerides: 142 mg/dL (ref <150)
VLDL: 28 mg/dL (ref 0–40)

## 2016-05-23 LAB — PSA: PSA: 0.83 ng/mL (ref 0.00–4.00)

## 2016-05-30 ENCOUNTER — Ambulatory Visit: Payer: Self-pay | Admitting: Physician Assistant

## 2016-05-30 ENCOUNTER — Encounter: Payer: Self-pay | Admitting: Physician Assistant

## 2016-05-30 VITALS — BP 126/74 | HR 69 | Temp 97.9°F | Ht 68.75 in | Wt 224.0 lb

## 2016-05-30 DIAGNOSIS — E785 Hyperlipidemia, unspecified: Secondary | ICD-10-CM

## 2016-05-30 DIAGNOSIS — Z6833 Body mass index (BMI) 33.0-33.9, adult: Secondary | ICD-10-CM

## 2016-05-30 DIAGNOSIS — E669 Obesity, unspecified: Secondary | ICD-10-CM

## 2016-05-30 DIAGNOSIS — J449 Chronic obstructive pulmonary disease, unspecified: Secondary | ICD-10-CM

## 2016-05-30 DIAGNOSIS — I10 Essential (primary) hypertension: Secondary | ICD-10-CM

## 2016-05-30 NOTE — Progress Notes (Signed)
BP 126/74 (BP Location: Left Arm, Patient Position: Sitting, Cuff Size: Large)   Pulse 69   Temp 97.9 F (36.6 C) (Other (Comment))   Ht 5' 8.75" (1.746 m)   Wt 224 lb (101.6 kg)   SpO2 95%   BMI 33.32 kg/m    Subjective:    Patient ID: Devin Ho, male    DOB: 1970-09-09, 46 y.o.   MRN: [id number]  HPI: Devin Ho is a 46 y.o. male presenting on 05/30/2016 for Hyperlipidemia and Hypertension   HPI   Pt is doing well.  No complaints today  Relevant past medical, surgical, family and social history reviewed and updated as indicated. Interim medical history since our last visit reviewed. Allergies and medications reviewed and updated.   Current Outpatient Prescriptions:  .  albuterol (PROVENTIL HFA;VENTOLIN HFA) 108 (90 BASE) MCG/ACT inhaler, 2 puffs q 6 hr prn. haga 2 inhalaciones en los pulmones cada 6 horas cuando necesite para simbilancias o falta de aire, Disp: 1 Inhaler, Rfl: 3 .  budesonide-formoterol (SYMBICORT) 160-4.5 MCG/ACT inhaler, Inhale 2 puffs into the lungs daily. Reported on 06/27/2015, Disp: , Rfl:  .  lisinopril (PRINIVIL,ZESTRIL) 10 MG tablet, Take 1 tablet (10 mg total) by mouth daily. Tome una tableta por boca diaria, Disp: 30 tablet, Rfl: 5 .  pravastatin (PRAVACHOL) 40 MG tablet, 1 po qhs.  Tome una tableta por boca al dormir, Disp: 30 tablet, Rfl: 4   Review of Systems  Constitutional: Negative for appetite change, chills, diaphoresis, fatigue, fever and unexpected weight change.  HENT: Negative for congestion, dental problem, drooling, ear pain, facial swelling, hearing loss, mouth sores, sneezing, sore throat, trouble swallowing and voice change.   Eyes: Negative for pain, discharge, redness, itching and visual disturbance.  Respiratory: Negative for cough, choking, shortness of breath and wheezing.   Cardiovascular: Negative for chest pain, palpitations and leg swelling.  Gastrointestinal: Negative for abdominal pain, blood in stool,  constipation, diarrhea and vomiting.  Endocrine: Negative for cold intolerance, heat intolerance and polydipsia.  Genitourinary: Negative for decreased urine volume, dysuria and hematuria.  Musculoskeletal: Negative for arthralgias, back pain and gait problem.  Skin: Negative for rash.  Allergic/Immunologic: Negative for environmental allergies.  Neurological: Negative for seizures, syncope, light-headedness and headaches.  Hematological: Negative for adenopathy.  Psychiatric/Behavioral: Negative for agitation, dysphoric mood and suicidal ideas. The patient is not nervous/anxious.     Per HPI unless specifically indicated above     Objective:    BP 126/74 (BP Location: Left Arm, Patient Position: Sitting, Cuff Size: Large)   Pulse 69   Temp 97.9 F (36.6 C) (Other (Comment))   Ht 5' 8.75" (1.746 m)   Wt 224 lb (101.6 kg)   SpO2 95%   BMI 33.32 kg/m   Wt Readings from Last 3 Encounters:  05/30/16 224 lb (101.6 kg)  02/29/16 227 lb 8 oz (103.2 kg)  10/26/15 226 lb 4 oz (102.6 kg)    Physical Exam  Constitutional: He is oriented to person, place, and time. He appears well-developed and well-nourished.  HENT:  Head: Normocephalic and atraumatic.  Neck: Neck supple.  Cardiovascular: Normal rate and regular rhythm.   Pulmonary/Chest: Effort normal and breath sounds normal. He has no wheezes.  Abdominal: Soft. Bowel sounds are normal. There is no hepatosplenomegaly. There is no tenderness.  Musculoskeletal: He exhibits no edema.  Lymphadenopathy:    He has no cervical adenopathy.  Neurological: He is alert and oriented to person, place, and time.  Skin: Skin is  warm and dry.  Psychiatric: He has a normal mood and affect. His behavior is normal.  Vitals reviewed.   Results for orders placed or performed during the hospital encounter of 05/23/16  Comprehensive metabolic panel  Result Value Ref Range   Sodium 136 135 - 145 mmol/L   Potassium 3.8 3.5 - 5.1 mmol/L   Chloride  107 101 - 111 mmol/L   CO2 22 22 - 32 mmol/L   Glucose, Bld 102 (H) 65 - 99 mg/dL   BUN 20 6 - 20 mg/dL   Creatinine, Ser 1.61 0.61 - 1.24 mg/dL   Calcium 9.1 8.9 - 09.6 mg/dL   Total Protein 7.6 6.5 - 8.1 g/dL   Albumin 4.4 3.5 - 5.0 g/dL   AST 34 15 - 41 U/L   ALT 33 17 - 63 U/L   Alkaline Phosphatase 58 38 - 126 U/L   Total Bilirubin 1.3 (H) 0.3 - 1.2 mg/dL   GFR calc non Af Amer >60 >60 mL/min   GFR calc Af Amer >60 >60 mL/min   Anion gap 7 5 - 15  Lipid panel  Result Value Ref Range   Cholesterol 167 0 - 200 mg/dL   Triglycerides 045 <409 mg/dL   HDL 45 >81 mg/dL   Total CHOL/HDL Ratio 3.7 RATIO   VLDL 28 0 - 40 mg/dL   LDL Cholesterol 94 0 - 99 mg/dL  PSA  Result Value Ref Range   PSA 0.83 0.00 - 4.00 ng/mL      Assessment & Plan:   Encounter Diagnoses  Name Primary?  . Essential hypertension Yes  . Hyperlipidemia, unspecified hyperlipidemia type   . Chronic obstructive pulmonary disease, unspecified COPD type (HCC)   . Class 1 obesity with body mass index (BMI) of 33.0 to 33.9 in adult, unspecified obesity type, unspecified whether serious comorbidity present     -reviewed labs with pt -pt to continue current medications -pt to follow up in 4 months. RTO sooner prn

## 2016-07-06 ENCOUNTER — Other Ambulatory Visit: Payer: Self-pay | Admitting: Physician Assistant

## 2016-07-12 ENCOUNTER — Other Ambulatory Visit: Payer: Self-pay | Admitting: Physician Assistant

## 2016-07-12 DIAGNOSIS — E785 Hyperlipidemia, unspecified: Secondary | ICD-10-CM

## 2016-07-12 DIAGNOSIS — I10 Essential (primary) hypertension: Secondary | ICD-10-CM

## 2016-09-24 ENCOUNTER — Other Ambulatory Visit (HOSPITAL_COMMUNITY)
Admission: RE | Admit: 2016-09-24 | Discharge: 2016-09-24 | Disposition: A | Discharge status: Home / Self Care | Payer: Self-pay | Source: Ambulatory Visit | Attending: Physician Assistant | Admitting: Physician Assistant

## 2016-09-24 DIAGNOSIS — I10 Essential (primary) hypertension: Secondary | ICD-10-CM | POA: Insufficient documentation

## 2016-09-24 DIAGNOSIS — E785 Hyperlipidemia, unspecified: Secondary | ICD-10-CM | POA: Insufficient documentation

## 2016-09-24 LAB — LIPID PANEL
CHOL/HDL RATIO: 4.2 ratio
CHOLESTEROL: 175 mg/dL (ref 0–200)
HDL: 42 mg/dL (ref >40)
LDL CALC: 93 mg/dL (ref 0–99)
TRIGLYCERIDES: 201 mg/dL — AB (ref <150)
VLDL: 40 mg/dL (ref 0–40)

## 2016-09-24 LAB — COMPREHENSIVE METABOLIC PANEL
ALT: 28 U/L (ref 17–63)
AST: 25 U/L (ref 15–41)
Albumin: 4.2 g/dL (ref 3.5–5.0)
Alkaline Phosphatase: 62 U/L (ref 38–126)
Anion gap: 8 (ref 5–15)
BUN: 15 mg/dL (ref 6–20)
CHLORIDE: 101 mmol/L (ref 101–111)
CO2: 28 mmol/L (ref 22–32)
Calcium: 9.3 mg/dL (ref 8.9–10.3)
Creatinine, Ser: 1 mg/dL (ref 0.61–1.24)
Glucose, Bld: 101 mg/dL — ABNORMAL HIGH (ref 65–99)
POTASSIUM: 4 mmol/L (ref 3.5–5.1)
SODIUM: 137 mmol/L (ref 135–145)
Total Bilirubin: 0.7 mg/dL (ref 0.3–1.2)
Total Protein: 7.7 g/dL (ref 6.5–8.1)

## 2016-10-01 ENCOUNTER — Ambulatory Visit: Payer: Self-pay | Admitting: Physician Assistant

## 2016-10-01 ENCOUNTER — Encounter: Payer: Self-pay | Admitting: Physician Assistant

## 2016-10-01 VITALS — BP 118/82 | HR 82 | Temp 97.9°F | Ht 68.75 in | Wt 224.0 lb

## 2016-10-01 DIAGNOSIS — Z6833 Body mass index (BMI) 33.0-33.9, adult: Secondary | ICD-10-CM

## 2016-10-01 DIAGNOSIS — E669 Obesity, unspecified: Secondary | ICD-10-CM

## 2016-10-01 DIAGNOSIS — J449 Chronic obstructive pulmonary disease, unspecified: Secondary | ICD-10-CM

## 2016-10-01 DIAGNOSIS — I10 Essential (primary) hypertension: Secondary | ICD-10-CM

## 2016-10-01 DIAGNOSIS — E785 Hyperlipidemia, unspecified: Secondary | ICD-10-CM

## 2016-10-01 MED ORDER — LISINOPRIL 10 MG PO TABS: ORAL_TABLET | ORAL | 5 refills | Status: DC

## 2016-10-01 MED ORDER — PRAVASTATIN SODIUM 40 MG PO TABS: ORAL_TABLET | ORAL | 4 refills | Status: DC

## 2016-10-01 MED ORDER — ALBUTEROL SULFATE HFA 108 (90 BASE) MCG/ACT IN AERS: INHALATION_SPRAY | RESPIRATORY_TRACT | 3 refills | Status: AC

## 2016-10-01 NOTE — Progress Notes (Signed)
BP 118/82 (BP Location: Left Arm, Patient Position: Sitting, Cuff Size: Large)   Pulse 82   Temp 97.9 F (36.6 C) (Other (Comment))   Ht 5' 8.75" (1.746 m)   Wt 224 lb (101.6 kg)   SpO2 97%   BMI 33.32 kg/m    Subjective:    Patient ID: Devin Ho, male    DOB: 1970-08-18, 46 y.o.   MRN: [id number]  HPI: Devin Ho is a 45 y.o. male presenting on 10/01/2016 for Hypertension and Hyperlipidemia   HPI   Pt is still seeing dr Renette Butters, the pulmologist.  Pt is doing well today and has no complaints.  His wife is helping to translate for him today  Relevant past medical, surgical, family and social history reviewed and updated as indicated. Interim medical history since our last visit reviewed. Allergies and medications reviewed and updated.   Current Outpatient Prescriptions:  .  albuterol (PROVENTIL HFA;VENTOLIN HFA) 108 (90 BASE) MCG/ACT inhaler, 2 puffs q 6 hr prn. haga 2 inhalaciones en los pulmones cada 6 horas cuando necesite para simbilancias o falta de aire, Disp: 1 Inhaler, Rfl: 3 .  budesonide-formoterol (SYMBICORT) 160-4.5 MCG/ACT inhaler, Inhale 2 puffs into the lungs daily. Reported on 06/27/2015, Disp: , Rfl:  .  lisinopril (PRINIVIL,ZESTRIL) 10 MG tablet, 1 po qd.  Tome una tableta por boca diaria, Disp: 30 tablet, Rfl: 5 .  pravastatin (PRAVACHOL) 40 MG tablet, 1 po qhs.  Tome una tableta por boca al dormir, Disp: 30 tablet, Rfl: 4   Review of Systems  Constitutional: Negative for appetite change, chills, diaphoresis, fatigue, fever and unexpected weight change.  HENT: Negative for congestion, dental problem, drooling, ear pain, facial swelling, hearing loss, mouth sores, sneezing, sore throat, trouble swallowing and voice change.   Eyes: Negative for pain, discharge, redness, itching and visual disturbance.  Respiratory: Negative for cough, choking, shortness of breath and wheezing.   Cardiovascular: Negative for chest pain, palpitations and leg swelling.   Gastrointestinal: Negative for abdominal pain, blood in stool, constipation, diarrhea and vomiting.  Endocrine: Negative for cold intolerance, heat intolerance and polydipsia.  Genitourinary: Negative for decreased urine volume, dysuria and hematuria.  Musculoskeletal: Negative for arthralgias, back pain and gait problem.  Skin: Negative for rash.  Allergic/Immunologic: Negative for environmental allergies.  Neurological: Negative for seizures, syncope, light-headedness and headaches.  Hematological: Negative for adenopathy.  Psychiatric/Behavioral: Negative for agitation, dysphoric mood and suicidal ideas. The patient is not nervous/anxious.     Per HPI unless specifically indicated above     Objective:    BP 118/82 (BP Location: Left Arm, Patient Position: Sitting, Cuff Size: Large)   Pulse 82   Temp 97.9 F (36.6 C) (Other (Comment))   Ht 5' 8.75" (1.746 m)   Wt 224 lb (101.6 kg)   SpO2 97%   BMI 33.32 kg/m   Wt Readings from Last 3 Encounters:  10/01/16 224 lb (101.6 kg)  05/30/16 224 lb (101.6 kg)  02/29/16 227 lb 8 oz (103.2 kg)    Physical Exam  Constitutional: He is oriented to person, place, and time. He appears well-developed and well-nourished.  HENT:  Head: Normocephalic and atraumatic.  Neck: Neck supple.  Cardiovascular: Normal rate and regular rhythm.   Pulmonary/Chest: Effort normal and breath sounds normal. He has no wheezes.  Abdominal: Soft. Bowel sounds are normal. There is no hepatosplenomegaly. There is no tenderness.  Musculoskeletal: He exhibits no edema.  Lymphadenopathy:    He has no cervical adenopathy.  Neurological: He  is alert and oriented to person, place, and time.  Skin: Skin is warm and dry.  Psychiatric: He has a normal mood and affect. His behavior is normal.  Vitals reviewed.   Results for orders placed or performed during the hospital encounter of 09/24/16  Comprehensive metabolic panel  Result Value Ref Range   Sodium 137 135  - 145 mmol/L   Potassium 4.0 3.5 - 5.1 mmol/L   Chloride 101 101 - 111 mmol/L   CO2 28 22 - 32 mmol/L   Glucose, Bld 101 (H) 65 - 99 mg/dL   BUN 15 6 - 20 mg/dL   Creatinine, Ser 1.61 0.61 - 1.24 mg/dL   Calcium 9.3 8.9 - 09.6 mg/dL   Total Protein 7.7 6.5 - 8.1 g/dL   Albumin 4.2 3.5 - 5.0 g/dL   AST 25 15 - 41 U/L   ALT 28 17 - 63 U/L   Alkaline Phosphatase 62 38 - 126 U/L   Total Bilirubin 0.7 0.3 - 1.2 mg/dL   GFR calc non Af Amer >60 >60 mL/min   GFR calc Af Amer >60 >60 mL/min   Anion gap 8 5 - 15  Lipid panel  Result Value Ref Range   Cholesterol 175 0 - 200 mg/dL   Triglycerides 045 (H) <150 mg/dL   HDL 42 >40 mg/dL   Total CHOL/HDL Ratio 4.2 RATIO   VLDL 40 0 - 40 mg/dL   LDL Cholesterol 93 0 - 99 mg/dL      Assessment & Plan:    Encounter Diagnoses  Name Primary?  . Essential hypertension Yes  . Hyperlipidemia, unspecified hyperlipidemia type   . Chronic obstructive pulmonary disease, unspecified COPD type (HCC)   . Class 1 obesity with body mass index (BMI) of 33.0 to 33.9 in adult, unspecified obesity type, unspecified whether serious comorbidity present      -reveiwed labs with pt -sent labs to dr Isidore Moos per pt request -pt to Continue current medications -pt counseled to watch lowfat diet -pt to follow up 4 months. RTO sooner prn

## 2016-10-08 ENCOUNTER — Ambulatory Visit: Payer: Self-pay | Admitting: Physician Assistant

## 2016-12-17 ENCOUNTER — Emergency Department (HOSPITAL_COMMUNITY)
Admission: EM | Admit: 2016-12-17 | Discharge: 2016-12-17 | Disposition: A | Discharge status: Home / Self Care | Payer: No Typology Code available for payment source | Attending: Emergency Medicine | Admitting: Emergency Medicine

## 2016-12-17 ENCOUNTER — Encounter (HOSPITAL_COMMUNITY): Payer: Self-pay | Admitting: Cardiology

## 2016-12-17 ENCOUNTER — Emergency Department (HOSPITAL_COMMUNITY): Payer: No Typology Code available for payment source

## 2016-12-17 DIAGNOSIS — Z79899 Other long term (current) drug therapy: Secondary | ICD-10-CM | POA: Diagnosis not present

## 2016-12-17 DIAGNOSIS — J449 Chronic obstructive pulmonary disease, unspecified: Secondary | ICD-10-CM | POA: Insufficient documentation

## 2016-12-17 DIAGNOSIS — Z87891 Personal history of nicotine dependence: Secondary | ICD-10-CM | POA: Diagnosis not present

## 2016-12-17 DIAGNOSIS — Z041 Encounter for examination and observation following transport accident: Secondary | ICD-10-CM | POA: Insufficient documentation

## 2016-12-17 DIAGNOSIS — I10 Essential (primary) hypertension: Secondary | ICD-10-CM | POA: Insufficient documentation

## 2016-12-17 MED ORDER — TRAMADOL HCL 50 MG PO TABS: 50.0000 mg | ORAL_TABLET | Freq: Four times a day (QID) | ORAL | 0 refills | Status: DC | PRN

## 2016-12-17 MED ORDER — IBUPROFEN 800 MG PO TABS: 800.0000 mg | ORAL_TABLET | Freq: Three times a day (TID) | ORAL | 0 refills | Status: DC | PRN
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
## 2016-12-17 NOTE — Discharge Instructions (Signed)
Follow up if any problems °
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
## 2017-01-24 ENCOUNTER — Other Ambulatory Visit (HOSPITAL_COMMUNITY)
Admission: RE | Admit: 2017-01-24 | Discharge: 2017-01-24 | Disposition: A | Discharge status: Home / Self Care | Payer: Self-pay | Source: Ambulatory Visit | Attending: Physician Assistant | Admitting: Physician Assistant

## 2017-01-24 DIAGNOSIS — I10 Essential (primary) hypertension: Secondary | ICD-10-CM | POA: Insufficient documentation

## 2017-01-24 DIAGNOSIS — E785 Hyperlipidemia, unspecified: Secondary | ICD-10-CM | POA: Insufficient documentation

## 2017-01-24 DIAGNOSIS — R351 Nocturia: Secondary | ICD-10-CM | POA: Insufficient documentation

## 2017-01-24 LAB — COMPREHENSIVE METABOLIC PANEL
ALBUMIN: 4.1 g/dL (ref 3.5–5.0)
ALK PHOS: 65 U/L (ref 38–126)
ALT: 20 U/L (ref 17–63)
ANION GAP: 11 (ref 5–15)
AST: 16 U/L (ref 15–41)
BUN: 22 mg/dL — AB (ref 6–20)
CALCIUM: 9.3 mg/dL (ref 8.9–10.3)
CO2: 25 mmol/L (ref 22–32)
CREATININE: 1.1 mg/dL (ref 0.61–1.24)
Chloride: 100 mmol/L — ABNORMAL LOW (ref 101–111)
GFR calc Af Amer: >60 mL/min (ref >60)
GFR calc non Af Amer: >60 mL/min (ref >60)
GLUCOSE: 96 mg/dL (ref 65–99)
POTASSIUM: 3.8 mmol/L (ref 3.5–5.1)
Sodium: 136 mmol/L (ref 135–145)
TOTAL PROTEIN: 7.6 g/dL (ref 6.5–8.1)
Total Bilirubin: 0.6 mg/dL (ref 0.3–1.2)

## 2017-01-24 LAB — LIPID PANEL
CHOL/HDL RATIO: 3.9 ratio
Cholesterol: 233 mg/dL — ABNORMAL HIGH (ref 0–200)
HDL: 59 mg/dL (ref >40)
LDL Cholesterol: 134 mg/dL — ABNORMAL HIGH (ref 0–99)
Triglycerides: 199 mg/dL — ABNORMAL HIGH (ref <150)
VLDL: 40 mg/dL (ref 0–40)

## 2017-01-24 LAB — PSA: PROSTATIC SPECIFIC ANTIGEN: 0.48 ng/mL (ref 0.00–4.00)

## 2017-01-31 ENCOUNTER — Encounter: Payer: Self-pay | Admitting: Physician Assistant

## 2017-01-31 ENCOUNTER — Ambulatory Visit: Payer: Self-pay | Admitting: Physician Assistant

## 2017-01-31 VITALS — BP 118/79 | HR 82 | Temp 98.1°F | Ht 68.75 in | Wt 226.8 lb

## 2017-01-31 DIAGNOSIS — E785 Hyperlipidemia, unspecified: Secondary | ICD-10-CM

## 2017-01-31 DIAGNOSIS — Z6833 Body mass index (BMI) 33.0-33.9, adult: Secondary | ICD-10-CM

## 2017-01-31 DIAGNOSIS — J449 Chronic obstructive pulmonary disease, unspecified: Secondary | ICD-10-CM

## 2017-01-31 DIAGNOSIS — E669 Obesity, unspecified: Secondary | ICD-10-CM

## 2017-01-31 DIAGNOSIS — I10 Essential (primary) hypertension: Secondary | ICD-10-CM

## 2017-01-31 MED ORDER — SIMVASTATIN 20 MG PO TABS: ORAL_TABLET | ORAL | 5 refills | Status: DC

## 2017-01-31 NOTE — Progress Notes (Signed)
BP 118/79 (BP Location: Left Arm, Patient Position: Sitting, Cuff Size: Normal)   Pulse 82   Temp 98.1 F (36.7 C)   Ht 5' 8.75" (1.746 m)   Wt 226 lb 12 oz (102.9 kg)   SpO2 95%   BMI 33.73 kg/m    Subjective:    Patient ID: Devin Ho, male    DOB: September 07, 1970, 47 y.o.   MRN: [id number]  HPI: Devin Ho is a 47 y.o. male presenting on 01/31/2017 for Hypertension and Hyperlipidemia   HPI   He has been out of his pravastatin for about a month  Pt is doing well and has no complaints  Pt's wife is in with him today  Relevant past medical, surgical, family and social history reviewed and updated as indicated. Interim medical history since our last visit reviewed. Allergies and medications reviewed and updated.   Current Outpatient Medications:  .  albuterol (PROVENTIL HFA;VENTOLIN HFA) 108 (90 Base) MCG/ACT inhaler, 2 puffs q 6 hr prn. haga 2 inhalaciones en los pulmones cada 6 horas cuando necesite para simbilancias o falta de aire, Disp: 1 Inhaler, Rfl: 3 .  budesonide-formoterol (SYMBICORT) 160-4.5 MCG/ACT inhaler, Inhale 2 puffs into the lungs daily. Reported on 06/27/2015, Disp: , Rfl:  .  cyclobenzaprine (FLEXERIL) 10 MG tablet, Take 10 mg by mouth 3 (three) times daily as needed for muscle spasms., Disp: , Rfl:  .  lisinopril (PRINIVIL,ZESTRIL) 10 MG tablet, 1 po qd.  Tome una tableta por boca diaria, Disp: 30 tablet, Rfl: 5 .  ibuprofen (ADVIL,MOTRIN) 800 MG tablet, Take 1 tablet (800 mg total) by mouth every 8 (eight) hours as needed for moderate pain. (Patient not taking: Reported on 01/31/2017), Disp: 20 tablet, Rfl: 0 .  pravastatin (PRAVACHOL) 40 MG tablet, 1 po qhs.  Tome una tableta por boca al dormir (Patient not taking: Reported on 01/31/2017), Disp: 30 tablet, Rfl: 4 .  traMADol (ULTRAM) 50 MG tablet, Take 1 tablet (50 mg total) by mouth every 6 (six) hours as needed. (Patient not taking: Reported on 01/31/2017), Disp: 20 tablet, Rfl: 0   Review of Systems   Constitutional: Negative for appetite change, chills, diaphoresis, fatigue, fever and unexpected weight change.  HENT: Negative for congestion, dental problem, drooling, ear pain, facial swelling, hearing loss, mouth sores, sneezing, sore throat, trouble swallowing and voice change.   Eyes: Negative for pain, discharge, redness, itching and visual disturbance.  Respiratory: Negative for cough, choking, shortness of breath and wheezing.   Cardiovascular: Negative for chest pain, palpitations and leg swelling.  Gastrointestinal: Negative for abdominal pain, blood in stool, constipation, diarrhea and vomiting.  Endocrine: Negative for cold intolerance, heat intolerance and polydipsia.  Genitourinary: Negative for decreased urine volume, dysuria and hematuria.  Musculoskeletal: Negative for arthralgias, back pain and gait problem.  Skin: Negative for rash.  Allergic/Immunologic: Negative for environmental allergies.  Neurological: Negative for seizures, syncope, light-headedness and headaches.  Hematological: Negative for adenopathy.  Psychiatric/Behavioral: Negative for agitation, dysphoric mood and suicidal ideas. The patient is not nervous/anxious.     Per HPI unless specifically indicated above     Objective:    BP 118/79 (BP Location: Left Arm, Patient Position: Sitting, Cuff Size: Normal)   Pulse 82   Temp 98.1 F (36.7 C)   Ht 5' 8.75" (1.746 m)   Wt 226 lb 12 oz (102.9 kg)   SpO2 95%   BMI 33.73 kg/m   Wt Readings from Last 3 Encounters:  01/31/17 226 lb 12 oz (102.9  kg)  12/17/16 225 lb (102.1 kg)  10/01/16 224 lb (101.6 kg)    Physical Exam  Constitutional: He is oriented to person, place, and time. He appears well-developed and well-nourished.  HENT:  Head: Normocephalic and atraumatic.  Neck: Neck supple.  Cardiovascular: Normal rate and regular rhythm.  Pulmonary/Chest: Effort normal and breath sounds normal. He has no wheezes.  Abdominal: Soft. Bowel sounds are  normal. There is no hepatosplenomegaly. There is no tenderness.  Musculoskeletal: He exhibits no edema.  Lymphadenopathy:    He has no cervical adenopathy.  Neurological: He is alert and oriented to person, place, and time.  Skin: Skin is warm and dry.  Psychiatric: He has a normal mood and affect. His behavior is normal.  Vitals reviewed.   Results for orders placed or performed during the hospital encounter of 01/24/17  Lipid Profile  Result Value Ref Range   Cholesterol 233 (H) 0 - 200 mg/dL   Triglycerides 098199 (H) <150 mg/dL   HDL 59 >11>40 mg/dL   Total CHOL/HDL Ratio 3.9 RATIO   VLDL 40 0 - 40 mg/dL   LDL Cholesterol 914134 (H) 0 - 99 mg/dL  Comprehensive metabolic panel  Result Value Ref Range   Sodium 136 135 - 145 mmol/L   Potassium 3.8 3.5 - 5.1 mmol/L   Chloride 100 (L) 101 - 111 mmol/L   CO2 25 22 - 32 mmol/L   Glucose, Bld 96 65 - 99 mg/dL   BUN 22 (H) 6 - 20 mg/dL   Creatinine, Ser 7.821.10 0.61 - 1.24 mg/dL   Calcium 9.3 8.9 - 95.610.3 mg/dL   Total Protein 7.6 6.5 - 8.1 g/dL   Albumin 4.1 3.5 - 5.0 g/dL   AST 16 15 - 41 U/L   ALT 20 17 - 63 U/L   Alkaline Phosphatase 65 38 - 126 U/L   Total Bilirubin 0.6 0.3 - 1.2 mg/dL   GFR calc non Af Amer >60 >60 mL/min   GFR calc Af Amer >60 >60 mL/min   Anion gap 11 5 - 15  PSA  Result Value Ref Range   Prostatic Specific Antigen 0.48 0.00 - 4.00 ng/mL      Assessment & Plan:   Encounter Diagnoses  Name Primary?  . Essential hypertension Yes  . Hyperlipidemia, unspecified hyperlipidemia type   . Chronic obstructive pulmonary disease, unspecified COPD type (HCC)   . Class 1 obesity with body mass index (BMI) of 33.0 to 33.9 in adult, unspecified obesity type, unspecified whether serious comorbidity present     -reviewed labs with pt -will Change to simvastatin due to change in $4 list at [facility]. continue lowfat diet and encouraged regular exercise -pt to Continue lisinopril -Follow up 3 months.  RTO sooner prn

## 2017-04-19 ENCOUNTER — Other Ambulatory Visit (HOSPITAL_COMMUNITY)
Admission: RE | Admit: 2017-04-19 | Discharge: 2017-04-19 | Disposition: A | Discharge status: Home / Self Care | Payer: Self-pay | Source: Ambulatory Visit | Attending: Physician Assistant | Admitting: Physician Assistant

## 2017-04-19 DIAGNOSIS — E785 Hyperlipidemia, unspecified: Secondary | ICD-10-CM | POA: Insufficient documentation

## 2017-04-19 DIAGNOSIS — I10 Essential (primary) hypertension: Secondary | ICD-10-CM | POA: Insufficient documentation

## 2017-04-19 LAB — COMPREHENSIVE METABOLIC PANEL
ALK PHOS: 56 U/L (ref 38–126)
ALT: 37 U/L (ref 17–63)
AST: 32 U/L (ref 15–41)
Albumin: 4.3 g/dL (ref 3.5–5.0)
Anion gap: 11 (ref 5–15)
BUN: 14 mg/dL (ref 6–20)
CALCIUM: 9.5 mg/dL (ref 8.9–10.3)
CO2: 24 mmol/L (ref 22–32)
CREATININE: 0.9 mg/dL (ref 0.61–1.24)
Chloride: 104 mmol/L (ref 101–111)
Glucose, Bld: 91 mg/dL (ref 65–99)
Potassium: 3.8 mmol/L (ref 3.5–5.1)
Sodium: 139 mmol/L (ref 135–145)
Total Bilirubin: 1.4 mg/dL — ABNORMAL HIGH (ref 0.3–1.2)
Total Protein: 7.8 g/dL (ref 6.5–8.1)

## 2017-04-19 LAB — LIPID PANEL
Cholesterol: 129 mg/dL (ref 0–200)
HDL: 47 mg/dL (ref >40)
LDL Cholesterol: 67 mg/dL (ref 0–99)
TRIGLYCERIDES: 75 mg/dL (ref <150)
Total CHOL/HDL Ratio: 2.7 RATIO
VLDL: 15 mg/dL (ref 0–40)

## 2017-04-24 ENCOUNTER — Encounter: Payer: Self-pay | Admitting: Physician Assistant

## 2017-04-24 ENCOUNTER — Ambulatory Visit: Payer: Self-pay | Admitting: Physician Assistant

## 2017-04-24 VITALS — BP 116/68 | HR 76 | Temp 98.1°F | Ht 68.75 in | Wt 224.0 lb

## 2017-04-24 DIAGNOSIS — R05 Cough: Secondary | ICD-10-CM

## 2017-04-24 DIAGNOSIS — E785 Hyperlipidemia, unspecified: Secondary | ICD-10-CM

## 2017-04-24 DIAGNOSIS — J449 Chronic obstructive pulmonary disease, unspecified: Secondary | ICD-10-CM

## 2017-04-24 DIAGNOSIS — E669 Obesity, unspecified: Secondary | ICD-10-CM

## 2017-04-24 DIAGNOSIS — I10 Essential (primary) hypertension: Secondary | ICD-10-CM

## 2017-04-24 DIAGNOSIS — Z6833 Body mass index (BMI) 33.0-33.9, adult: Secondary | ICD-10-CM

## 2017-04-24 DIAGNOSIS — R059 Cough, unspecified: Secondary | ICD-10-CM

## 2017-04-24 MED ORDER — BENZONATATE 100 MG PO CAPS
ORAL_CAPSULE | ORAL | 3 refills | Status: DC
Start: 2017-04-24 — End: 2017-09-04

## 2017-04-24 NOTE — Progress Notes (Signed)
BP 116/68 (BP Location: Left Arm, Patient Position: Sitting, Cuff Size: Large)   Pulse 76   Temp 98.1 F (36.7 C) (Other (Comment))   Ht 5' 8.75" (1.746 m)   Wt 224 lb (101.6 kg)   SpO2 96%   BMI 33.32 kg/m    Subjective:    Patient ID: Devin Ho, male    DOB: 1970/10/11, 47 y.o.   MRN: [id number]  HPI: Devin Ho is a 47 y.o. male presenting on 04/24/2017 for Hypertension and Hyperlipidemia   HPI  Pt's wife is with him today  Pt is doing well but has had some cough for 4 days.  He used some OTC [place] medicine for it that helped some.   Relevant past medical, surgical, family and social history reviewed and updated as indicated. Interim medical history since our last visit reviewed. Allergies and medications reviewed and updated.   Current Outpatient Medications:  .  albuterol (PROVENTIL HFA;VENTOLIN HFA) 108 (90 Base) MCG/ACT inhaler, 2 puffs q 6 hr prn. haga 2 inhalaciones en los pulmones cada 6 horas cuando necesite para simbilancias o falta de aire, Disp: 1 Inhaler, Rfl: 3 .  budesonide-formoterol (SYMBICORT) 160-4.5 MCG/ACT inhaler, Inhale 2 puffs into the lungs daily. Reported on 06/27/2015, Disp: , Rfl:  .  cyclobenzaprine (FLEXERIL) 10 MG tablet, Take 10 mg by mouth 3 (three) times daily as needed for muscle spasms., Disp: , Rfl:  .  lisinopril (PRINIVIL,ZESTRIL) 10 MG tablet, 1 po qd.  Tome una tableta por boca diaria, Disp: 30 tablet, Rfl: 5 .  simvastatin (ZOCOR) 20 MG tablet, 1 po qhs for cholesterol.  Tome una tableta por boca al dormir para Print production planner, Disp: 30 tablet, Rfl: 5   Review of Systems  Constitutional: Negative for appetite change, chills, diaphoresis, fatigue, fever and unexpected weight change.  HENT: Negative for congestion, dental problem, drooling, ear pain, facial swelling, hearing loss, mouth sores, sneezing, sore throat, trouble swallowing and voice change.   Eyes: Negative for pain, discharge, redness, itching and visual disturbance.   Respiratory: Positive for cough. Negative for choking, shortness of breath and wheezing.   Cardiovascular: Negative for chest pain, palpitations and leg swelling.  Gastrointestinal: Negative for abdominal pain, blood in stool, constipation, diarrhea and vomiting.  Endocrine: Negative for cold intolerance, heat intolerance and polydipsia.  Genitourinary: Negative for decreased urine volume, dysuria and hematuria.  Musculoskeletal: Negative for arthralgias, back pain and gait problem.  Skin: Negative for rash.  Allergic/Immunologic: Negative for environmental allergies.  Neurological: Negative for seizures, syncope, light-headedness and headaches.  Hematological: Negative for adenopathy.  Psychiatric/Behavioral: Negative for agitation, dysphoric mood and suicidal ideas. The patient is not nervous/anxious.     Per HPI unless specifically indicated above     Objective:    BP 116/68 (BP Location: Left Arm, Patient Position: Sitting, Cuff Size: Large)   Pulse 76   Temp 98.1 F (36.7 C) (Other (Comment))   Ht 5' 8.75" (1.746 m)   Wt 224 lb (101.6 kg)   SpO2 96%   BMI 33.32 kg/m   Wt Readings from Last 3 Encounters:  04/24/17 224 lb (101.6 kg)  01/31/17 226 lb 12 oz (102.9 kg)  12/17/16 225 lb (102.1 kg)    Physical Exam  Constitutional: He is oriented to person, place, and time. He appears well-developed and well-nourished.  HENT:  Head: Normocephalic and atraumatic.  Right Ear: Hearing, tympanic membrane, external ear and ear canal normal.  Left Ear: Hearing, tympanic membrane, external ear and ear canal  normal.  Nose: Nose normal.  Mouth/Throat: Uvula is midline and oropharynx is clear and moist. No uvula swelling. No oropharyngeal exudate, posterior oropharyngeal edema, posterior oropharyngeal erythema or tonsillar abscesses.  Neck: Neck supple.  Cardiovascular: Normal rate and regular rhythm.  Pulmonary/Chest: Effort normal and breath sounds normal. He has no wheezes.   Abdominal: Soft. Bowel sounds are normal. There is no hepatosplenomegaly. There is no tenderness.  Musculoskeletal: He exhibits no edema.  Lymphadenopathy:    He has no cervical adenopathy.  Neurological: He is alert and oriented to person, place, and time.  Skin: Skin is warm and dry.  Psychiatric: He has a normal mood and affect. His behavior is normal.  Vitals reviewed.   Results for orders placed or performed during the hospital encounter of 04/19/17  Lipid Profile  Result Value Ref Range   Cholesterol 129 0 - 200 mg/dL   Triglycerides 75 <295<150 mg/dL   HDL 47 >62>40 mg/dL   Total CHOL/HDL Ratio 2.7 RATIO   VLDL 15 0 - 40 mg/dL   LDL Cholesterol 67 0 - 99 mg/dL  Comprehensive metabolic panel  Result Value Ref Range   Sodium 139 135 - 145 mmol/L   Potassium 3.8 3.5 - 5.1 mmol/L   Chloride 104 101 - 111 mmol/L   CO2 24 22 - 32 mmol/L   Glucose, Bld 91 65 - 99 mg/dL   BUN 14 6 - 20 mg/dL   Creatinine, Ser 1.300.90 0.61 - 1.24 mg/dL   Calcium 9.5 8.9 - 86.510.3 mg/dL   Total Protein 7.8 6.5 - 8.1 g/dL   Albumin 4.3 3.5 - 5.0 g/dL   AST 32 15 - 41 U/L   ALT 37 17 - 63 U/L   Alkaline Phosphatase 56 38 - 126 U/L   Total Bilirubin 1.4 (H) 0.3 - 1.2 mg/dL   GFR calc non Af Amer >60 >60 mL/min   GFR calc Af Amer >60 >60 mL/min   Anion gap 11 5 - 15      Assessment & Plan:   Encounter Diagnoses  Name Primary?  . Essential hypertension Yes  . Hyperlipidemia, unspecified hyperlipidemia type   . Chronic obstructive pulmonary disease, unspecified COPD type (HCC)   . Cough   . Class 1 obesity with body mass index (BMI) of 33.0 to 33.9 in adult, unspecified obesity type, unspecified whether serious comorbidity present      - reviewed labs with pt -pt to continue current medications and healthy diet -rx tessalon to use prn cough -pt to follow up 3 months.  RTO sooner prn

## 2017-07-17 ENCOUNTER — Other Ambulatory Visit (HOSPITAL_COMMUNITY)
Admission: RE | Admit: 2017-07-17 | Discharge: 2017-07-17 | Disposition: A | Discharge status: Home / Self Care | Payer: Self-pay | Source: Ambulatory Visit | Attending: Physician Assistant | Admitting: Physician Assistant

## 2017-07-17 DIAGNOSIS — E785 Hyperlipidemia, unspecified: Secondary | ICD-10-CM | POA: Insufficient documentation

## 2017-07-17 DIAGNOSIS — I10 Essential (primary) hypertension: Secondary | ICD-10-CM | POA: Insufficient documentation

## 2017-07-17 LAB — LIPID PANEL
CHOL/HDL RATIO: 3 ratio
Cholesterol: 163 mg/dL (ref 0–200)
HDL: 55 mg/dL (ref >40)
LDL CALC: 92 mg/dL (ref 0–99)
TRIGLYCERIDES: 78 mg/dL (ref <150)
VLDL: 16 mg/dL (ref 0–40)

## 2017-07-17 LAB — COMPREHENSIVE METABOLIC PANEL
ALT: 23 U/L (ref 0–44)
ANION GAP: 7 (ref 5–15)
AST: 22 U/L (ref 15–41)
Albumin: 4.1 g/dL (ref 3.5–5.0)
Alkaline Phosphatase: 70 U/L (ref 38–126)
BUN: 18 mg/dL (ref 6–20)
CHLORIDE: 105 mmol/L (ref 98–111)
CO2: 27 mmol/L (ref 22–32)
Calcium: 9.1 mg/dL (ref 8.9–10.3)
Creatinine, Ser: 1 mg/dL (ref 0.61–1.24)
Glucose, Bld: 101 mg/dL — ABNORMAL HIGH (ref 70–99)
POTASSIUM: 4.4 mmol/L (ref 3.5–5.1)
Sodium: 139 mmol/L (ref 135–145)
TOTAL PROTEIN: 7.4 g/dL (ref 6.5–8.1)
Total Bilirubin: 1.1 mg/dL (ref 0.3–1.2)

## 2017-07-22 ENCOUNTER — Encounter: Payer: Self-pay | Admitting: Physician Assistant

## 2017-07-22 ENCOUNTER — Ambulatory Visit: Payer: Self-pay | Admitting: Physician Assistant

## 2017-07-22 VITALS — BP 132/80 | HR 90 | Temp 97.5°F | Ht 68.75 in | Wt 225.0 lb

## 2017-07-22 DIAGNOSIS — Z6833 Body mass index (BMI) 33.0-33.9, adult: Secondary | ICD-10-CM

## 2017-07-22 DIAGNOSIS — E669 Obesity, unspecified: Secondary | ICD-10-CM

## 2017-07-22 DIAGNOSIS — E785 Hyperlipidemia, unspecified: Secondary | ICD-10-CM

## 2017-07-22 DIAGNOSIS — I10 Essential (primary) hypertension: Secondary | ICD-10-CM

## 2017-07-22 NOTE — Progress Notes (Signed)
BP 132/80 (BP Location: Left Arm, Patient Position: Sitting, Cuff Size: Normal)   Pulse 90   Temp (!) 97.5 F (36.4 C)   Ht 5' 8.75" (1.746 m)   Wt 225 lb (102.1 kg)   SpO2 96%   BMI 33.47 kg/m    Subjective:    Patient ID: Devin Ho, male    DOB: [birth year], 47 y.o.   MRN: [id number]  HPI: Devin Ho is a 47 y.o. male presenting on 07/22/2017 for Hypertension (pt went to Dr Joellyn Quails and was taken off lisinopril and stated on losartan due to cough and sore throat) and Hyperlipidemia   HPI   Chief Complaint  Patient presents with  . Hypertension    pt went to Dr Joellyn Quails and was taken off lisinopril and stated on losartan due to cough and sore throat  . Hyperlipidemia    Pt's wife is translating for him.    Pt has been going to [facility].  Discussed with pt that it is not appropriate and can be unsafe to be going to more that one primary care and he needs to decide to continue with dr Laureen Ochs or to stay at [facility].  Pt is not working  Relevant past medical, surgical, family and social history reviewed and updated as indicated. Interim medical history since our last visit reviewed. Allergies and medications reviewed and updated.  CURRENT MEDS: Albuterol Losartan simvastatin  Review of Systems  Constitutional: Negative for appetite change, chills, diaphoresis, fatigue, fever and unexpected weight change.  HENT: Negative for congestion, dental problem, drooling, ear pain, facial swelling, hearing loss, mouth sores, sneezing, sore throat, trouble swallowing and voice change.   Eyes: Negative for pain, discharge, redness, itching and visual disturbance.  Respiratory: Negative for cough, choking, shortness of breath and wheezing.   Cardiovascular: Negative for chest pain, palpitations and leg swelling.  Gastrointestinal: Negative for abdominal pain, blood in stool, constipation, diarrhea and vomiting.  Endocrine: Negative for cold intolerance, heat  intolerance and polydipsia.  Genitourinary: Negative for decreased urine volume, dysuria and hematuria.  Musculoskeletal: Positive for back pain. Negative for arthralgias and gait problem.  Skin: Negative for rash.  Allergic/Immunologic: Negative for environmental allergies.  Neurological: Negative for seizures, syncope, light-headedness and headaches.  Hematological: Negative for adenopathy.  Psychiatric/Behavioral: Negative for agitation, dysphoric mood and suicidal ideas. The patient is not nervous/anxious.     Per HPI unless specifically indicated above     Objective:    BP 132/80 (BP Location: Left Arm, Patient Position: Sitting, Cuff Size: Normal)   Pulse 90   Temp (!) 97.5 F (36.4 C)   Ht 5' 8.75" (1.746 m)   Wt 225 lb (102.1 kg)   SpO2 96%   BMI 33.47 kg/m   Wt Readings from Last 3 Encounters:  07/22/17 225 lb (102.1 kg)  04/24/17 224 lb (101.6 kg)  01/31/17 226 lb 12 oz (102.9 kg)    Physical Exam  Constitutional: He is oriented to person, place, and time. He appears well-developed and well-nourished.  HENT:  Head: Normocephalic and atraumatic.  Neck: Neck supple.  Cardiovascular: Normal rate and regular rhythm.  Pulmonary/Chest: Effort normal and breath sounds normal. He has no wheezes.  Abdominal: Soft. Bowel sounds are normal. There is no hepatosplenomegaly. There is no tenderness.  Musculoskeletal: He exhibits no edema.  Lymphadenopathy:    He has no cervical adenopathy.  Neurological: He is alert and oriented to person, place, and time.  Skin: Skin is warm and dry.  Psychiatric:  He has a normal mood and affect. His behavior is normal.  Vitals reviewed.   Results for orders placed or performed during the hospital encounter of 07/17/17  Lipid panel  Result Value Ref Range   Cholesterol 163 0 - 200 mg/dL   Triglycerides 78 <161<150 mg/dL   HDL 55 >09>40 mg/dL   Total CHOL/HDL Ratio 3.0 RATIO   VLDL 16 0 - 40 mg/dL   LDL Cholesterol 92 0 - 99 mg/dL   Comprehensive metabolic panel  Result Value Ref Range   Sodium 139 135 - 145 mmol/L   Potassium 4.4 3.5 - 5.1 mmol/L   Chloride 105 98 - 111 mmol/L   CO2 27 22 - 32 mmol/L   Glucose, Bld 101 (H) 70 - 99 mg/dL   BUN 18 6 - 20 mg/dL   Creatinine, Ser 6.041.00 0.61 - 1.24 mg/dL   Calcium 9.1 8.9 - 54.010.3 mg/dL   Total Protein 7.4 6.5 - 8.1 g/dL   Albumin 4.1 3.5 - 5.0 g/dL   AST 22 15 - 41 U/L   ALT 23 0 - 44 U/L   Alkaline Phosphatase 70 38 - 126 U/L   Total Bilirubin 1.1 0.3 - 1.2 mg/dL   GFR calc non Af Amer >60 >60 mL/min   GFR calc Af Amer >60 >60 mL/min   Anion gap 7 5 - 15      Assessment & Plan:   Encounter Diagnoses  Name Primary?  . Essential hypertension Yes  . Hyperlipidemia, unspecified hyperlipidemia type   . Class 1 obesity with body mass index (BMI) of 33.0 to 33.9 in adult, unspecified obesity type, unspecified whether serious comorbidity present     -reviewed labs with pt -No changes today. Pt has only been on the losartan for about 2 weeks. -pt to decide where he wants to go for primary care.  He can cancel his follow up here in 3 months or at [facility], whichever place he decides not to go.  RTO sooner prn

## 2017-07-23 ENCOUNTER — Ambulatory Visit: Payer: Self-pay | Admitting: Physician Assistant

## 2017-08-17 ENCOUNTER — Other Ambulatory Visit: Payer: Self-pay | Admitting: Physician Assistant

## 2017-09-04 ENCOUNTER — Ambulatory Visit: Payer: Self-pay | Admitting: Physician Assistant

## 2017-09-04 ENCOUNTER — Encounter: Payer: Self-pay | Admitting: Physician Assistant

## 2017-09-04 VITALS — BP 118/76 | HR 73 | Temp 97.9°F | Ht 68.75 in | Wt 227.0 lb

## 2017-09-04 DIAGNOSIS — R21 Rash and other nonspecific skin eruption: Secondary | ICD-10-CM

## 2017-09-04 MED ORDER — PREDNISONE 10 MG PO TABS: ORAL_TABLET | ORAL | 0 refills | Status: DC

## 2017-09-04 NOTE — Progress Notes (Signed)
BP 118/76 (BP Location: Left Arm, Patient Position: Sitting, Cuff Size: Large)   Pulse 73   Temp 97.9 F (36.6 C) (Other (Comment))   Ht 5' 8.75" (1.746 m)   Wt 227 lb (103 kg)   SpO2 95%   BMI 33.77 kg/m    Subjective:    Patient ID: Devin Ho, male    DOB: 1970/11/06, 48 y.o.   MRN: [id number]  HPI: Devin Ho is a 47 y.o. male presenting on 09/04/2017 for Rash (c/o generalized rash that itches since last Monday, benadryl pills and cream give some temporary relief)   HPI Chief Complaint  Patient presents with  . Rash    c/o generalized rash that itches since last Monday, benadryl pills and cream give some temporary relief    Pt's wife is translating today.  Pt's rash started 9 days ago.   No new soaps, lotions, detergents, etc.   Relevant past medical, surgical, family and social history reviewed and updated as indicated. Interim medical history since our last visit reviewed. Allergies and medications reviewed and updated.   Current Outpatient Medications:  .  albuterol (PROVENTIL HFA;VENTOLIN HFA) 108 (90 Base) MCG/ACT inhaler, 2 puffs q 6 hr prn. haga 2 inhalaciones en los pulmones cada 6 horas cuando necesite para simbilancias o falta de aire, Disp: 1 Inhaler, Rfl: 3 .  budesonide-formoterol (SYMBICORT) 160-4.5 MCG/ACT inhaler, Inhale 2 puffs into the lungs daily. Reported on 06/27/2015, Disp: , Rfl:  .  diphenhydrAMINE (BENADRYL) 25 MG tablet, Take 50 mg by mouth every 6 (six) hours as needed., Disp: , Rfl:  .  losartan (COZAAR) 100 MG tablet, Take 100 mg by mouth daily., Disp: , Rfl:  .  simvastatin (ZOCOR) 20 MG tablet, TAKE 1 TABLET BY MOUTH ONCE DAILY AT BEDTIME FOR CHOLESTEROL, Disp: 30 tablet, Rfl: 5  Review of Systems  Constitutional: Negative for appetite change, chills, diaphoresis, fatigue, fever and unexpected weight change.  HENT: Negative for congestion, dental problem, drooling, ear pain, facial swelling, hearing loss, mouth sores, sneezing, sore  throat, trouble swallowing and voice change.   Eyes: Negative for pain, discharge, redness, itching and visual disturbance.  Respiratory: Negative for cough, choking, shortness of breath and wheezing.   Cardiovascular: Negative for chest pain, palpitations and leg swelling.  Gastrointestinal: Negative for abdominal pain, blood in stool, constipation, diarrhea and vomiting.  Endocrine: Negative for cold intolerance, heat intolerance and polydipsia.  Genitourinary: Negative for decreased urine volume, dysuria and hematuria.  Musculoskeletal: Negative for arthralgias, back pain and gait problem.  Skin: Positive for rash.  Allergic/Immunologic: Negative for environmental allergies.  Neurological: Negative for seizures, syncope, light-headedness and headaches.  Hematological: Negative for adenopathy.  Psychiatric/Behavioral: Negative for agitation, dysphoric mood and suicidal ideas. The patient is not nervous/anxious.     Per HPI unless specifically indicated above     Objective:    BP 118/76 (BP Location: Left Arm, Patient Position: Sitting, Cuff Size: Large)   Pulse 73   Temp 97.9 F (36.6 C) (Other (Comment))   Ht 5' 8.75" (1.746 m)   Wt 227 lb (103 kg)   SpO2 95%   BMI 33.77 kg/m   Wt Readings from Last 3 Encounters:  09/04/17 227 lb (103 kg)  07/22/17 225 lb (102.1 kg)  04/24/17 224 lb (101.6 kg)    Physical Exam  Constitutional: He is oriented to person, place, and time. He appears well-developed and well-nourished.  HENT:  Head: Atraumatic.  Pulmonary/Chest: Effort normal. No respiratory distress.  Neurological: He  is alert and oriented to person, place, and time.  Skin: Skin is warm and dry.  eruption BUE extending onto shoulders, anterior and posterior.  Legs spared.  Face spared  Psychiatric: He has a normal mood and affect. His behavior is normal.  Nursing note and vitals reviewed.       Assessment & Plan:   Encounter Diagnosis  Name Primary?  . Rash Yes     -claritin or zyrtec or benedryl -rx prednisone taper -pt counseled to avoid hot showers, scratching -follow up as scheduled.  RTO sooner prn

## 2017-09-04 NOTE — Progress Notes (Signed)
Pt's spouse called on 09-03-17 and left voicemail asking if pt needed and appointment to be seen or if he needed to go to the ER. Pt's spouse states pt has had an allergic reaction to something and has been using benadryl for the past several days and has not had improvement.  LPN called back on 09-03-17 to speak with patient, but per pt's spouse, pt was unavailable due to being out working. LPN notified pt's spouse that LPN is unable to discuss pt's treatment with her since pt has not signed Health Information Restrictions and Permissions form authorizing the office to discuss with her.  LPN notified pt's spouse that pt could be seen at 2:15pm on 09-03-17 if she's able to contact patient to notify. Pt's spouse states she will call back if pt can make it. LPN notified that appointment will not be scheduled until either patient or pt's spouse calls to confirm pt can make it. Pt's spouse verbalized understanding and stated she would call back.

## 2017-10-22 ENCOUNTER — Encounter: Payer: Self-pay | Admitting: Physician Assistant

## 2017-10-22 ENCOUNTER — Ambulatory Visit: Payer: Self-pay | Admitting: Physician Assistant

## 2017-10-22 VITALS — BP 137/77 | HR 96 | Temp 97.5°F

## 2017-10-22 DIAGNOSIS — J449 Chronic obstructive pulmonary disease, unspecified: Secondary | ICD-10-CM

## 2017-10-22 DIAGNOSIS — I10 Essential (primary) hypertension: Secondary | ICD-10-CM

## 2017-10-22 DIAGNOSIS — E785 Hyperlipidemia, unspecified: Secondary | ICD-10-CM

## 2017-10-22 MED ORDER — LOSARTAN POTASSIUM 100 MG PO TABS: 100.0000 mg | ORAL_TABLET | Freq: Every day | ORAL | 4 refills | Status: DC

## 2017-10-22 MED ORDER — SIMVASTATIN 20 MG PO TABS: ORAL_TABLET | ORAL | 5 refills | Status: DC

## 2017-10-22 NOTE — Progress Notes (Signed)
BP 137/77   Pulse 96   Temp (!) 97.5 F (36.4 C)   SpO2 95%    Subjective:    Patient ID: Devin Ho, male    DOB: 1970-12-04, 47 y.o.   MRN: [id number]  HPI: Devin Ho is a 47 y.o. male presenting on 10/22/2017 for Hypertension   HPI   Pt is doing well with no complaints  Relevant past medical, surgical, family and social history reviewed and updated as indicated. Interim medical history since our last visit reviewed. Allergies and medications reviewed and updated.   Current Outpatient Medications:  .  albuterol (PROVENTIL HFA;VENTOLIN HFA) 108 (90 Base) MCG/ACT inhaler, 2 puffs q 6 hr prn. haga 2 inhalaciones en los pulmones cada 6 horas cuando necesite para simbilancias o falta de aire, Disp: 1 Inhaler, Rfl: 3 .  budesonide-formoterol (SYMBICORT) 160-4.5 MCG/ACT inhaler, Inhale 2 puffs into the lungs daily. Reported on 06/27/2015, Disp: , Rfl:  .  losartan (COZAAR) 100 MG tablet, Take 100 mg by mouth daily., Disp: , Rfl:  .  simvastatin (ZOCOR) 20 MG tablet, TAKE 1 TABLET BY MOUTH ONCE DAILY AT BEDTIME FOR CHOLESTEROL, Disp: 30 tablet, Rfl: 5  Review of Systems  Constitutional: Negative for appetite change, chills, diaphoresis, fatigue, fever and unexpected weight change.  HENT: Negative for congestion, drooling, ear pain, facial swelling, hearing loss, mouth sores, sneezing, sore throat, trouble swallowing and voice change.   Eyes: Negative for pain, discharge, redness, itching and visual disturbance.  Respiratory: Negative for cough, choking, shortness of breath and wheezing.   Cardiovascular: Negative for chest pain, palpitations and leg swelling.  Gastrointestinal: Negative for abdominal pain, blood in stool, constipation, diarrhea and vomiting.  Endocrine: Negative for cold intolerance, heat intolerance and polydipsia.  Genitourinary: Negative for decreased urine volume, dysuria and hematuria.  Musculoskeletal: Negative for arthralgias, back pain and gait problem.   Skin: Negative for rash.  Allergic/Immunologic: Negative for environmental allergies.  Neurological: Negative for seizures, syncope, light-headedness and headaches.  Hematological: Negative for adenopathy.  Psychiatric/Behavioral: Negative for agitation, dysphoric mood and suicidal ideas. The patient is not nervous/anxious.     Per HPI unless specifically indicated above     Objective:    BP 137/77   Pulse 96   Temp (!) 97.5 F (36.4 C)   SpO2 95%   Wt Readings from Last 3 Encounters:  09/04/17 227 lb (103 kg)  07/22/17 225 lb (102.1 kg)  04/24/17 224 lb (101.6 kg)    Physical Exam  Constitutional: He is oriented to person, place, and time. He appears well-developed and well-nourished.  HENT:  Head: Normocephalic and atraumatic.  Neck: Neck supple.  Cardiovascular: Normal rate and regular rhythm.  Pulmonary/Chest: Effort normal and breath sounds normal. He has no wheezes.  Abdominal: Soft. Bowel sounds are normal. There is no hepatosplenomegaly. There is no tenderness.  Musculoskeletal: He exhibits no edema.  Lymphadenopathy:    He has no cervical adenopathy.  Neurological: He is alert and oriented to person, place, and time.  Skin: Skin is warm and dry.  Psychiatric: He has a normal mood and affect. His behavior is normal.  Vitals reviewed.     Assessment & Plan:   Encounter Diagnoses  Name Primary?  . Essential hypertension Yes  . Hyperlipidemia, unspecified hyperlipidemia type   . Chronic obstructive pulmonary disease, unspecified COPD type (HCC)    -pt to get Fasting labs drawn tomorrow morning.   After this can check q 6 months if good -pt to continue current  medications -pt to follow up to recheck bp in 3 months.  RTO sooner prn

## 2017-10-23 ENCOUNTER — Other Ambulatory Visit (HOSPITAL_COMMUNITY)
Admission: RE | Admit: 2017-10-23 | Discharge: 2017-10-23 | Disposition: A | Discharge status: Home / Self Care | Payer: Self-pay | Source: Ambulatory Visit | Attending: Physician Assistant | Admitting: Physician Assistant

## 2017-10-23 DIAGNOSIS — I10 Essential (primary) hypertension: Secondary | ICD-10-CM | POA: Insufficient documentation

## 2017-10-23 DIAGNOSIS — E785 Hyperlipidemia, unspecified: Secondary | ICD-10-CM | POA: Insufficient documentation

## 2017-10-23 LAB — LIPID PANEL
CHOL/HDL RATIO: 3.9 ratio
CHOLESTEROL: 165 mg/dL (ref 0–200)
HDL: 42 mg/dL (ref >40)
LDL Cholesterol: 97 mg/dL (ref 0–99)
TRIGLYCERIDES: 131 mg/dL (ref <150)
VLDL: 26 mg/dL (ref 0–40)

## 2017-10-23 LAB — COMPREHENSIVE METABOLIC PANEL
ALK PHOS: 59 U/L (ref 38–126)
ALT: 26 U/L (ref 0–44)
ANION GAP: 7 (ref 5–15)
AST: 26 U/L (ref 15–41)
Albumin: 4.1 g/dL (ref 3.5–5.0)
BILIRUBIN TOTAL: 1 mg/dL (ref 0.3–1.2)
BUN: 15 mg/dL (ref 6–20)
CALCIUM: 9 mg/dL (ref 8.9–10.3)
CO2: 24 mmol/L (ref 22–32)
Chloride: 105 mmol/L (ref 98–111)
Creatinine, Ser: 0.86 mg/dL (ref 0.61–1.24)
GLUCOSE: 106 mg/dL — AB (ref 70–99)
POTASSIUM: 3.9 mmol/L (ref 3.5–5.1)
Sodium: 136 mmol/L (ref 135–145)
TOTAL PROTEIN: 7.4 g/dL (ref 6.5–8.1)

## 2018-01-22 ENCOUNTER — Encounter: Payer: Self-pay | Admitting: Physician Assistant

## 2018-01-22 ENCOUNTER — Ambulatory Visit: Payer: Self-pay | Admitting: Physician Assistant

## 2018-01-22 VITALS — BP 113/65 | HR 65 | Temp 97.6°F | Ht 68.75 in | Wt 235.5 lb

## 2018-01-22 DIAGNOSIS — I10 Essential (primary) hypertension: Secondary | ICD-10-CM

## 2018-01-22 DIAGNOSIS — E785 Hyperlipidemia, unspecified: Secondary | ICD-10-CM

## 2018-01-22 MED ORDER — SIMVASTATIN 20 MG PO TABS: ORAL_TABLET | ORAL | 5 refills | Status: DC

## 2018-01-22 MED ORDER — LOSARTAN POTASSIUM 100 MG PO TABS: 100.0000 mg | ORAL_TABLET | Freq: Every day | ORAL | 4 refills | Status: DC

## 2018-01-22 NOTE — Progress Notes (Signed)
BP 113/65 (BP Location: Right Arm, Patient Position: Sitting, Cuff Size: Normal)   Pulse 65   Temp 97.6 F (36.4 C) (Other (Comment))   Ht 5' 8.75" (1.746 m)   Wt 235 lb 8 oz (106.8 kg)   SpO2 97%   BMI 35.03 kg/m    Subjective:    Patient ID: Devin Ho, male    DOB: January 05, 1971, 48 y.o.   MRN: [id number]  HPI: Devin Ho is a 48 y.o. male presenting on 01/22/2018 for Hypertension   HPI   Pt is doing well and denies problem  Relevant past medical, surgical, family and social history reviewed and updated as indicated. Interim medical history since our last visit reviewed. Allergies and medications reviewed and updated.   Current Outpatient Medications:  .  albuterol (PROVENTIL HFA;VENTOLIN HFA) 108 (90 Base) MCG/ACT inhaler, 2 puffs q 6 hr prn. haga 2 inhalaciones en los pulmones cada 6 horas cuando necesite para simbilancias o falta de aire, Disp: 1 Inhaler, Rfl: 3 .  budesonide-formoterol (SYMBICORT) 160-4.5 MCG/ACT inhaler, Inhale 2 puffs into the lungs daily. Reported on 06/27/2015, Disp: , Rfl:  .  losartan (COZAAR) 100 MG tablet, Take 1 tablet (100 mg total) by mouth daily., Disp: 30 tablet, Rfl: 4 .  simvastatin (ZOCOR) 20 MG tablet, TAKE 1 TABLET BY MOUTH ONCE DAILY AT BEDTIME FOR CHOLESTEROL, Disp: 30 tablet, Rfl: 5     Review of Systems  Constitutional: Negative for appetite change, chills, diaphoresis, fatigue, fever and unexpected weight change.  HENT: Negative for congestion, dental problem, drooling, ear pain, facial swelling, hearing loss, mouth sores, sneezing, sore throat, trouble swallowing and voice change.   Eyes: Negative for pain, discharge, redness, itching and visual disturbance.  Respiratory: Negative for cough, choking, shortness of breath and wheezing.   Cardiovascular: Negative for chest pain, palpitations and leg swelling.  Gastrointestinal: Negative for abdominal pain, blood in stool, constipation, diarrhea and vomiting.  Endocrine: Negative  for cold intolerance, heat intolerance and polydipsia.  Genitourinary: Negative for decreased urine volume, dysuria and hematuria.  Musculoskeletal: Negative for arthralgias, back pain and gait problem.  Skin: Negative for rash.  Allergic/Immunologic: Negative for environmental allergies.  Neurological: Negative for seizures, syncope, light-headedness and headaches.  Hematological: Negative for adenopathy.  Psychiatric/Behavioral: Negative for agitation, dysphoric mood and suicidal ideas. The patient is not nervous/anxious.     Per HPI unless specifically indicated above     Objective:    BP 113/65 (BP Location: Right Arm, Patient Position: Sitting, Cuff Size: Normal)   Pulse 65   Temp 97.6 F (36.4 C) (Other (Comment))   Ht 5' 8.75" (1.746 m)   Wt 235 lb 8 oz (106.8 kg)   SpO2 97%   BMI 35.03 kg/m   Wt Readings from Last 3 Encounters:  01/22/18 235 lb 8 oz (106.8 kg)  09/04/17 227 lb (103 kg)  07/22/17 225 lb (102.1 kg)    Physical Exam Vitals signs reviewed.  Constitutional:      Appearance: He is well-developed.  HENT:     Head: Normocephalic and atraumatic.  Neck:     Musculoskeletal: Neck supple.  Cardiovascular:     Rate and Rhythm: Normal rate and regular rhythm.  Pulmonary:     Effort: Pulmonary effort is normal.     Breath sounds: Normal breath sounds. No wheezing.  Abdominal:     General: Bowel sounds are normal.     Palpations: Abdomen is soft.     Tenderness: There is no abdominal tenderness.  Lymphadenopathy:     Cervical: No cervical adenopathy.  Skin:    General: Skin is warm and dry.  Neurological:     Mental Status: He is alert and oriented to person, place, and time.  Psychiatric:        Behavior: Behavior normal.         Assessment & Plan:     Encounter Diagnoses  Name Primary?  . Essential hypertension Yes  . Hyperlipidemia, unspecified hyperlipidemia type     -No changes today -pt to continue current meds -pt to follow up in  3 months.  If stable, will extend OV to q 6 mo.  Pt to RTO sooner prn

## 2018-04-23 ENCOUNTER — Ambulatory Visit: Payer: Self-pay | Admitting: Physician Assistant

## 2018-07-02 ENCOUNTER — Other Ambulatory Visit (HOSPITAL_COMMUNITY)
Admission: RE | Admit: 2018-07-02 | Discharge: 2018-07-02 | Disposition: A | Discharge status: Home / Self Care | Payer: Self-pay | Source: Ambulatory Visit | Attending: Physician Assistant | Admitting: Physician Assistant

## 2018-07-02 DIAGNOSIS — R69 Illness, unspecified: Secondary | ICD-10-CM | POA: Insufficient documentation

## 2018-07-02 LAB — COMPREHENSIVE METABOLIC PANEL
ALT: 33 U/L (ref 0–44)
AST: 29 U/L (ref 15–41)
Albumin: 4.2 g/dL (ref 3.5–5.0)
Alkaline Phosphatase: 68 U/L (ref 38–126)
Anion gap: 11 (ref 5–15)
BUN: 15 mg/dL (ref 6–20)
CO2: 25 mmol/L (ref 22–32)
Calcium: 9.2 mg/dL (ref 8.9–10.3)
Chloride: 102 mmol/L (ref 98–111)
Creatinine, Ser: 0.92 mg/dL (ref 0.61–1.24)
GFR calc Af Amer: >60 mL/min (ref >60)
GFR calc non Af Amer: >60 mL/min (ref >60)
Glucose, Bld: 106 mg/dL — ABNORMAL HIGH (ref 70–99)
Potassium: 4 mmol/L (ref 3.5–5.1)
Sodium: 138 mmol/L (ref 135–145)
Total Bilirubin: 1.1 mg/dL (ref 0.3–1.2)
Total Protein: 7.5 g/dL (ref 6.5–8.1)

## 2018-07-02 LAB — LIPID PANEL
Cholesterol: 166 mg/dL (ref 0–200)
HDL: 46 mg/dL (ref >40)
LDL Cholesterol: 96 mg/dL (ref 0–99)
Total CHOL/HDL Ratio: 3.6 RATIO
Triglycerides: 122 mg/dL (ref <150)
VLDL: 24 mg/dL (ref 0–40)

## 2018-07-08 ENCOUNTER — Encounter: Payer: Self-pay | Admitting: Physician Assistant

## 2018-07-08 ENCOUNTER — Ambulatory Visit: Payer: Self-pay | Admitting: Physician Assistant

## 2018-07-08 DIAGNOSIS — M25561 Pain in right knee: Secondary | ICD-10-CM

## 2018-07-08 DIAGNOSIS — J449 Chronic obstructive pulmonary disease, unspecified: Secondary | ICD-10-CM

## 2018-07-08 DIAGNOSIS — E785 Hyperlipidemia, unspecified: Secondary | ICD-10-CM

## 2018-07-08 DIAGNOSIS — I10 Essential (primary) hypertension: Secondary | ICD-10-CM

## 2018-07-08 MED ORDER — SIMVASTATIN 20 MG PO TABS: ORAL_TABLET | ORAL | 7 refills | Status: AC

## 2018-07-08 MED ORDER — LOSARTAN POTASSIUM 100 MG PO TABS: 100.0000 mg | ORAL_TABLET | Freq: Every day | ORAL | 8 refills | Status: AC

## 2018-07-08 NOTE — Progress Notes (Signed)
There were no vitals taken for this visit.   Subjective:    Patient ID: Devin Ho, male    DOB: [birth year], 48 y.o.   MRN: [id number]  HPI: Devin Ho is a 48 y.o. male presenting on 07/08/2018 for No chief complaint on file.   HPI   This is a telemedicine appointment through [employer] due to coronavirus pandemic  I connected with  Devin Ho on 07/08/18 by a video enabled telemedicine application and verified that I am speaking with the correct person using two identifiers.   I discussed the limitations of evaluation and management by telemedicine. The patient expressed understanding and agreed to proceed.   Pt is at home.  Provider is at office  Pt's wife is with him at home.  Pt is doing well.  He has been having some intermittent R knee pain- he took it to the chiropractor and got xrays done and was told to get his PCP to prescribe and anti-inflammatory.  Pt had No injury of the knee.  pain x 3 months.  Pain comes and goes.      Relevant past medical, surgical, family and social history reviewed and updated as indicated. Interim medical history since our last visit reviewed. Allergies and medications reviewed and updated.    Current Outpatient Medications:  .  albuterol (PROVENTIL HFA;VENTOLIN HFA) 108 (90 Base) MCG/ACT inhaler, 2 puffs q 6 hr prn. haga 2 inhalaciones en los pulmones cada 6 horas cuando necesite para simbilancias o falta de aire, Disp: 1 Inhaler, Rfl: 3 .  budesonide-formoterol (SYMBICORT) 160-4.5 MCG/ACT inhaler, Inhale 2 puffs into the lungs daily. Reported on 06/27/2015, Disp: , Rfl:  .  losartan (COZAAR) 100 MG tablet, Take 1 tablet (100 mg total) by mouth daily., Disp: 30 tablet, Rfl: 4 .  simvastatin (ZOCOR) 20 MG tablet, TAKE 1 TABLET BY MOUTH ONCE DAILY AT BEDTIME FOR CHOLESTEROL, Disp: 30 tablet, Rfl: 5   Review of Systems  Per HPI unless specifically indicated above     Objective:    There were no vitals taken for this visit.  Wt  Readings from Last 3 Encounters:  01/22/18 235 lb 8 oz (106.8 kg)  09/04/17 227 lb (103 kg)  07/22/17 225 lb (102.1 kg)    Physical Exam Constitutional:      General: He is not in acute distress.    Appearance: Normal appearance. He is obese. He is not ill-appearing.  HENT:     Head: Normocephalic and atraumatic.  Pulmonary:     Effort: Pulmonary effort is normal. No respiratory distress.  Musculoskeletal:     Right knee: He exhibits normal range of motion, no swelling, no ecchymosis and no deformity.     Right lower leg: No edema.     Left lower leg: No edema.  Neurological:     Mental Status: He is alert and oriented to person, place, and time.  Psychiatric:        Attention and Perception: Attention normal.        Speech: Speech normal.        Behavior: Behavior is cooperative.     Results for orders placed or performed during the hospital encounter of 07/02/18  Comprehensive metabolic panel  Result Value Ref Range   Sodium 138 135 - 145 mmol/L   Potassium 4.0 3.5 - 5.1 mmol/L   Chloride 102 98 - 111 mmol/L   CO2 25 22 - 32 mmol/L   Glucose, Bld 106 (H) 70 - 99 mg/dL  BUN 15 6 - 20 mg/dL   Creatinine, Ser 0.92 0.61 - 1.24 mg/dL   Calcium 9.2 8.9 - 10.3 mg/dL   Total Protein 7.5 6.5 - 8.1 g/dL   Albumin 4.2 3.5 - 5.0 g/dL   AST 29 15 - 41 U/L   ALT 33 0 - 44 U/L   Alkaline Phosphatase 68 38 - 126 U/L   Total Bilirubin 1.1 0.3 - 1.2 mg/dL   GFR calc non Af Amer >60 >60 mL/min   GFR calc Af Amer >60 >60 mL/min   Anion gap 11 5 - 15  Lipid panel  Result Value Ref Range   Cholesterol 166 0 - 200 mg/dL   Triglycerides 122 <150 mg/dL   HDL 46 >40 mg/dL   Total CHOL/HDL Ratio 3.6 RATIO   VLDL 24 0 - 40 mg/dL   LDL Cholesterol 96 0 - 99 mg/dL      Assessment & Plan:   Encounter Diagnoses  Name Primary?  . Essential hypertension Yes  . Hyperlipidemia, unspecified hyperlipidemia type   . Chronic obstructive pulmonary disease, unspecified COPD type ([place])   .  Right knee pain, unspecified chronicity      -Reviewed labs with pt -pt to Continue current rx -recommended Aleve and ice prn knee pain -Encouraged to wear mask when in public per CDC guidelines -Follow up 6 months.  He is to contact office sooner prn

## 2018-10-15 ENCOUNTER — Encounter: Payer: Self-pay | Admitting: Physician Assistant

## 2018-10-15 ENCOUNTER — Ambulatory Visit: Payer: Self-pay | Admitting: Physician Assistant

## 2018-10-15 DIAGNOSIS — Z789 Other specified health status: Secondary | ICD-10-CM

## 2018-10-15 DIAGNOSIS — L259 Unspecified contact dermatitis, unspecified cause: Secondary | ICD-10-CM

## 2018-10-15 MED ORDER — PREDNISONE 10 MG PO TABS: ORAL_TABLET | ORAL | 0 refills | Status: AC

## 2018-10-15 NOTE — Progress Notes (Signed)
   There were no vitals taken for this visit.   Subjective:    Patient ID: Devin Ho, male    DOB: 1970-07-19, 48 y.o.   MRN: [id number]  HPI: Devin Ho is a 48 y.o. male presenting on 10/15/2018 for No chief complaint on file.   HPI  This is a telemedicine appointment due to coronavirus pandemic.  It is via facebook since pt was unable to connect through [employer]  I connected with  Devin Ho on 10/15/18 by a video enabled telemedicine application and verified that I am speaking with the correct person using two identifiers.   I discussed the limitations of evaluation and management by telemedicine. The patient expressed understanding and agreed to proceed.  Pt is at work.  Provider is at office   Pt has a Rash- felt to be poison oak or poisn ivy.  It Started  5 days ago He is doing Biomedical scientist work now.  No self treat for the rash.    Relevant past medical, surgical, family and social history reviewed and updated as indicated. Interim medical history since our last visit reviewed. Allergies and medications reviewed and updated.   Current Outpatient Medications:  .  albuterol (PROVENTIL HFA;VENTOLIN HFA) 108 (90 Base) MCG/ACT inhaler, 2 puffs q 6 hr prn. haga 2 inhalaciones en los pulmones cada 6 horas cuando necesite para simbilancias o falta de aire, Disp: 1 Inhaler, Rfl: 3 .  budesonide-formoterol (SYMBICORT) 160-4.5 MCG/ACT inhaler, Inhale 2 puffs into the lungs daily. Reported on 06/27/2015, Disp: , Rfl:  .  losartan (COZAAR) 100 MG tablet, Take 1 tablet (100 mg total) by mouth daily., Disp: 30 tablet, Rfl: 8 .  simvastatin (ZOCOR) 20 MG tablet, TAKE 1 TABLET BY MOUTH ONCE DAILY AT BEDTIME FOR CHOLESTEROL, Disp: 30 tablet, Rfl: 7     Review of Systems  Per HPI unless specifically indicated above     Objective:    There were no vitals taken for this visit.  Wt Readings from Last 3 Encounters:  01/22/18 235 lb 8 oz (106.8 kg)  09/04/17 227 lb (103 kg)   07/22/17 225 lb (102.1 kg)    Physical Exam Constitutional:      General: He is not in acute distress.    Appearance: He is not ill-appearing.  HENT:     Head: Normocephalic and atraumatic.  Pulmonary:     Effort: No respiratory distress.  Skin:    Findings: Rash present.     Comments: Papular rash BUE visible.  No redness or signs infection  Neurological:     Mental Status: He is alert and oriented to person, place, and time.  Psychiatric:        Attention and Perception: Attention normal.        Speech: Speech normal.        Behavior: Behavior is cooperative.           Assessment & Plan:    Encounter Diagnoses  Name Primary?  . Contact dermatitis, unspecified contact dermatitis type, unspecified trigger Yes  . 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  He is to contact office sooner prn

## 2019-01-12 ENCOUNTER — Ambulatory Visit: Payer: Self-pay | Admitting: Physician Assistant
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
